# Patient Record
Sex: Male | Born: 1985 | Race: Black or African American | Hispanic: No | Marital: Married | Smoking: Current some day smoker
Health system: Southern US, Community
[De-identification: ages and names within clinical notes are randomized; demographics above are authoritative.]

## PROBLEM LIST (undated history)

## (undated) DIAGNOSIS — T7840XA Allergy, unspecified, initial encounter: Secondary | ICD-10-CM

## (undated) DIAGNOSIS — J45909 Unspecified asthma, uncomplicated: Secondary | ICD-10-CM

## (undated) HISTORY — DX: Allergy, unspecified, initial encounter: T78.40XA

---
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
## 2009-03-05 ENCOUNTER — Emergency Department (HOSPITAL_COMMUNITY): Admission: EM | Admit: 2009-03-05 | Discharge: 2009-03-05 | Payer: Self-pay | Admitting: Emergency Medicine

## 2009-03-08 ENCOUNTER — Emergency Department (HOSPITAL_COMMUNITY): Admission: EM | Admit: 2009-03-08 | Discharge: 2009-03-08 | Payer: Self-pay | Admitting: Emergency Medicine

## 2010-01-27 ENCOUNTER — Emergency Department (HOSPITAL_COMMUNITY): Admission: EM | Admit: 2010-01-27 | Discharge: 2010-01-27 | Payer: Self-pay | Admitting: Emergency Medicine

## 2010-03-01 ENCOUNTER — Emergency Department (HOSPITAL_COMMUNITY): Admission: EM | Admit: 2010-03-01 | Discharge: 2010-03-01 | Payer: Self-pay | Admitting: Emergency Medicine

## 2010-11-07 LAB — WOUND CULTURE

## 2013-01-12 ENCOUNTER — Emergency Department (HOSPITAL_COMMUNITY): Payer: Self-pay

## 2013-01-12 ENCOUNTER — Encounter (HOSPITAL_COMMUNITY): Payer: Self-pay | Admitting: Emergency Medicine

## 2013-01-12 ENCOUNTER — Emergency Department (HOSPITAL_COMMUNITY)
Admission: EM | Admit: 2013-01-12 | Discharge: 2013-01-12 | Disposition: A | Discharge status: Home / Self Care | Payer: Self-pay | Attending: Emergency Medicine | Admitting: Emergency Medicine

## 2013-01-12 DIAGNOSIS — R05 Cough: Secondary | ICD-10-CM | POA: Insufficient documentation

## 2013-01-12 DIAGNOSIS — R059 Cough, unspecified: Secondary | ICD-10-CM | POA: Insufficient documentation

## 2013-01-12 DIAGNOSIS — R6883 Chills (without fever): Secondary | ICD-10-CM | POA: Insufficient documentation

## 2013-01-12 DIAGNOSIS — R0602 Shortness of breath: Secondary | ICD-10-CM | POA: Insufficient documentation

## 2013-01-12 DIAGNOSIS — J45901 Unspecified asthma with (acute) exacerbation: Secondary | ICD-10-CM | POA: Insufficient documentation

## 2013-01-12 DIAGNOSIS — J029 Acute pharyngitis, unspecified: Secondary | ICD-10-CM | POA: Insufficient documentation

## 2013-01-12 HISTORY — DX: Unspecified asthma, uncomplicated: J45.909

## 2013-01-12 MED ORDER — ALBUTEROL SULFATE (5 MG/ML) 0.5% IN NEBU
2.5000 mg | INHALATION_SOLUTION | Freq: Once | RESPIRATORY_TRACT | Status: AC
  Administered 2013-01-12: 2.5 mg via RESPIRATORY_TRACT
  Filled 2013-01-12: qty 0.5

## 2013-01-12 MED ORDER — ALBUTEROL SULFATE HFA 108 (90 BASE) MCG/ACT IN AERS
2.0000 | INHALATION_SPRAY | Freq: Once | RESPIRATORY_TRACT | Status: AC
  Administered 2013-01-12: 2 via RESPIRATORY_TRACT
  Filled 2013-01-12 (×2): qty 6.7

## 2013-01-12 NOTE — ED Provider Notes (Signed)
History    This chart was scribed for non-physician practitioner working with Derwood Kaplan, MD by Leone Payor, ED Scribe. This patient was seen in room TR07C/TR07C and the patient's care was started at 2103.   CSN: [id number]  Arrival date & time 01/12/13  2103   None     Chief Complaint  Patient presents with  . URI      The history is provided by the patient. No language interpreter was used.    HPI Comments: Henry Brady is a 27 y.o. male with a h/o of asthma who presents to the Emergency Department complaining of ongoing, gradual onset, gradually worsening cough productive of green mucous starting 6 days ago. Pt has associated wheezing for which he used his daughter's nebulizer earlier today. States it gave him relief for 10 minutes. Pt also has chills and a sore throat that also started 6 days ago. He has asthma and the coughing is causing him to have trouble breathing. He denies fever.    Past Medical History  Diagnosis Date  . Asthma     History reviewed. No pertinent past surgical history.  History reviewed. No pertinent family history.  History  Substance Use Topics  . Smoking status: Not on file  . Smokeless tobacco: Not on file  . Alcohol Use: Not on file      Review of Systems  Constitutional: Positive for chills. Negative for fever.  HENT: Positive for sore throat.   Respiratory: Positive for cough and wheezing.   All other systems reviewed and are negative.    Allergies  Review of patient's allergies indicates no known allergies.  Home Medications  No current outpatient prescriptions on file.  BP 143/85  Pulse 103  Temp(Src) 98.4 F (36.9 C) (Oral)  Resp 16  SpO2 95%  Physical Exam  Nursing note and vitals reviewed. Constitutional: He is oriented to person, place, and time. He appears well-developed and well-nourished. No distress.  HENT:  Head: Normocephalic and atraumatic.  Mouth/Throat: Oropharynx is clear and moist. No  oropharyngeal exudate.  No sinus tenderness to palpation.   Eyes: EOM are normal.  Neck: Neck supple. No tracheal deviation present.  Cardiovascular: Normal rate, regular rhythm and normal heart sounds.   Pulmonary/Chest: Effort normal. No respiratory distress. He has wheezes.  Wheezes noted throughout bilateral lung fields, mostly on L side.   Musculoskeletal: Normal range of motion.  Neurological: He is alert and oriented to person, place, and time.  Skin: Skin is warm and dry.  Psychiatric: He has a normal mood and affect. His behavior is normal.    ED Course  Procedures (including critical care time)  DIAGNOSTIC STUDIES: Oxygen Saturation is 95% on room air, adequate by my interpretation.    COORDINATION OF CARE: 9:49 PM Discussed treatment plan with pt at bedside and pt agreed to plan.   Labs Reviewed - No data to display Dg Chest 2 View  01/12/2013   *RADIOLOGY REPORT*  Clinical Data: Shortness of breath and cough; history of asthma.  CHEST - 2 VIEW  Comparison: None.  Findings: The lungs are well-aerated and clear.  There is no evidence of focal opacification, pleural effusion or pneumothorax.  The heart is normal in size; the mediastinal contour is within normal limits.  No acute osseous abnormalities are seen.  IMPRESSION: No acute cardiopulmonary process seen.   Original Report Authenticated By: Tonia Ghent, M.D.     1. Asthma exacerbation       MDM  10:03 PM  Chest xray pending. Patient reports relief after nebulizer treatment. Vitals stable and patient afebrile.   10:24 PM Chest xray shows no acute changes. Patient will have albuterol inhaler to go home with. Patient instructed to return with worsening or concerning symptoms.     I personally performed the services described in this documentation, which was scribed in my presence. The recorded information has been reviewed and is accurate.   Emilia Beck, PA-C 01/12/13 2318   Medical screening  examination/treatment/procedure(s) were performed by non-physician practitioner and as supervising physician I was immediately available for consultation/collaboration.   Derwood Kaplan, MD 01/13/13 0028

## 2013-01-12 NOTE — ED Notes (Signed)
Pt states he used his daughters nebulizer tx at home earlier today, and it gave him relief for .

## 2013-01-12 NOTE — ED Notes (Signed)
Pt c/o cough, wheezing,chills and sore throat that started Monday. Pt states he has been coughing up greenish colored mucus.

## 2017-01-29 ENCOUNTER — Emergency Department (HOSPITAL_COMMUNITY)
Admission: EM | Admit: 2017-01-29 | Discharge: 2017-01-30 | Disposition: A | Discharge status: Home / Self Care | Payer: BLUE CROSS/BLUE SHIELD | Attending: Physician Assistant | Admitting: Physician Assistant

## 2017-01-29 ENCOUNTER — Encounter (HOSPITAL_COMMUNITY): Payer: Self-pay | Admitting: Emergency Medicine

## 2017-01-29 DIAGNOSIS — J45909 Unspecified asthma, uncomplicated: Secondary | ICD-10-CM | POA: Insufficient documentation

## 2017-01-29 DIAGNOSIS — K0889 Other specified disorders of teeth and supporting structures: Secondary | ICD-10-CM | POA: Diagnosis not present

## 2017-01-29 DIAGNOSIS — F17200 Nicotine dependence, unspecified, uncomplicated: Secondary | ICD-10-CM | POA: Insufficient documentation

## 2017-01-29 NOTE — ED Triage Notes (Signed)
Pt to ED bilateral upper dental pain x 3 days - states it is recurring, reports abscess on R upper side and possibly the same on L upper side. Pt denies fevers/chills.  +

## 2017-01-30 MED ORDER — LIDOCAINE VISCOUS 2 % MT SOLN: 15.0000 mL | OROMUCOSAL | 2 refills | Status: DC | PRN

## 2017-01-30 MED ORDER — HYDROCODONE-ACETAMINOPHEN 5-325 MG PO TABS: 1.0000 | ORAL_TABLET | Freq: Four times a day (QID) | ORAL | 0 refills | Status: DC | PRN

## 2017-01-30 MED ORDER — PENICILLIN V POTASSIUM 500 MG PO TABS: 500.0000 mg | ORAL_TABLET | Freq: Four times a day (QID) | ORAL | 0 refills | Status: AC

## 2017-01-30 MED ORDER — IBUPROFEN 600 MG PO TABS: 600.0000 mg | ORAL_TABLET | Freq: Four times a day (QID) | ORAL | 0 refills | Status: DC | PRN

## 2017-01-30 NOTE — Discharge Instructions (Signed)
You have been seen today for dental pain. You should follow up with a dentist as soon as possible. This problem will not resolve on its own without the care of a dentist. Use ibuprofen or naproxen for pain. Use the viscous lidocaine for mouth pain. Swish with the lidocaine and spit it out. Do not swallow it.  Antiinflammatory medications: Take 600 mg of ibuprofen every 6 hours or 440 mg (over the counter dose) to 500 mg (prescription dose) of naproxen every 12 hours or for the next 3 days. After this time, these medications may be used as needed for pain. Take these medications with food to avoid upset stomach. Choose only one of these medications, do not take them together.  Please take all of your antibiotics until finished!   You may develop abdominal discomfort or diarrhea from the antibiotic.  You may help offset this with probiotics which you can buy or get in yogurt. Do not eat or take the probiotics until 2 hours after your antibiotic.   [place], [address] [place], [place] [address] [phone]  [place], [place] [address] [phone]  [place] [address] [place], [place] [address] [phone] ext. [place] [address] [place] [address] [place], [place] [address] [phone]  [facility] [address] [place], [place] [address] [phone]  [facility] [employer] [email or web address]  [place] [address] [place], [place] [address] [phone]  Website for free, low-income, or sliding scale dental services in [place]: www.freedental.us  To find a dentist in [place] and surrounding areas: CardCollectible.com.ee  M[facility] [email or web address]  [facility] Medicaid  Dentist [email or web address]

## 2017-01-30 NOTE — ED Provider Notes (Signed)
[facility] DEPT Provider Note   CSN: [id number] Arrival date & time: 01/29/17  2251     History   Chief Complaint Chief Complaint  Patient presents with  . Dental Pain    HPI Henry Brady is a 31 y.o. male.  HPI   Henry Brady is a 31 y.o. male, with a history of Asthma, presenting to the ED with left upper rear most dental pain beginning last week. Pain is moderate to severe, throbbing, radiating towards the left ear. Right upper dental pain beginning 3 days ago. Same description. Has tried Tylenol without relief. Denies fever, nausea/vomiting, difficulty breathing or swallowing, or any other complaints.    Past Medical History:  Diagnosis Date  . Asthma     There are no active problems to display for this patient.   History reviewed. No pertinent surgical history.     Home Medications    Prior to Admission medications   Medication Sig Start Date End Date Taking? Authorizing Provider  HYDROcodone-acetaminophen (NORCO/VICODIN) 5-325 MG tablet Take 1 tablet by mouth every 6 (six) hours as needed. 01/30/17   Raziyah Vanvleck C, PA-C  ibuprofen (ADVIL,MOTRIN) 600 MG tablet Take 1 tablet (600 mg total) by mouth every 6 (six) hours as needed. 01/30/17   Abdoulie Tierce C, PA-C  lidocaine (XYLOCAINE) 2 % solution Use as directed 15 mLs in the mouth or throat as needed for mouth pain. 01/30/17   Jobie Popp C, PA-C  penicillin v potassium (VEETID) 500 MG tablet Take 1 tablet (500 mg total) by mouth 4 (four) times daily. 01/30/17 02/06/17  Lorayne Bender, PA-C    Family History No family history on file.  Social History Social History  Substance Use Topics  . Smoking status: Current Some Day Smoker    Types: Cigars  . Smokeless tobacco: Never Used  . Alcohol use Yes     Comment: occ     Allergies   Patient has no known allergies.   Review of Systems Review of Systems  Constitutional: Negative for chills and fever.  HENT: Positive for dental problem. Negative for  drooling and trouble swallowing.   Gastrointestinal: Negative for nausea and vomiting.     Physical Exam Updated Vital Signs BP 133/86 (BP Location: Left Arm)   Pulse 69   Temp 97.6 F (36.4 C) (Oral)   Resp 18   Ht 5\' 6"  (1.676 m)   Wt 61.2 kg (135 lb)   SpO2 100%   BMI 21.79 kg/m   Physical Exam  Constitutional: He appears well-developed and well-nourished. No distress.  HENT:  Head: Normocephalic and atraumatic.  Significant dental caries to the left upper rear most molar with tenderness to the tooth.  Tenderness and significant dental caries to the right upper first molar. No noted area of swelling or fluctuance. Handles oral secretions without difficulty. No trismus.  Eyes: Conjunctivae are normal.  Neck: Normal range of motion. Neck supple.  Cardiovascular: Normal rate and regular rhythm.   Pulmonary/Chest: Effort normal.  Lymphadenopathy:    He has no cervical adenopathy.  Neurological: He is alert.  Skin: Skin is warm and dry. He is not diaphoretic.  Psychiatric: He has a normal mood and affect. His behavior is normal.  Nursing note and vitals reviewed.    ED Treatments / Results  Labs (all labs ordered are listed, but only abnormal results are displayed) Labs Reviewed - No data to display  EKG  EKG Interpretation None       Radiology No  results found.  Procedures Procedures (including critical care time)  Medications Ordered in ED Medications - No data to display   Initial Impression / Assessment and Plan / ED Course  I have reviewed the triage vital signs and the nursing notes.  Pertinent labs & imaging results that were available during my care of the patient were reviewed by me and considered in my medical decision making (see chart for details).     Patient presents with dental pain. No signs of Ludwig's angioedema or sepsis. Patient was offered a dental block with explanation, but declined. Dentist follow-up. Resources given. The  patient was given instructions for home care as well as return precautions. Patient voices understanding of these instructions, accepts the plan, and is comfortable with discharge.   Final Clinical Impressions(s) / ED Diagnoses   Final diagnoses:  Pain, dental    New Prescriptions Discharge Medication List as of 01/30/2017  1:21 AM    START taking these medications   Details  HYDROcodone-acetaminophen (NORCO/VICODIN) 5-325 MG tablet Take 1 tablet by mouth every 6 (six) hours as needed., Starting Mon 01/30/2017, Print    ibuprofen (ADVIL,MOTRIN) 600 MG tablet Take 1 tablet (600 mg total) by mouth every 6 (six) hours as needed., Starting Mon 01/30/2017, Print    lidocaine (XYLOCAINE) 2 % solution Use as directed 15 mLs in the mouth or throat as needed for mouth pain., Starting Mon 01/30/2017, Print    penicillin v potassium (VEETID) 500 MG tablet Take 1 tablet (500 mg total) by mouth 4 (four) times daily., Starting Mon 01/30/2017, Until Mon 02/06/2017, Print         Justin Meisenheimer, [place] C, PA-C 01/31/17 9604    Macarthur Critchley, MD 02/02/17 0740

## 2017-11-15 ENCOUNTER — Emergency Department (HOSPITAL_COMMUNITY): Payer: BLUE CROSS/BLUE SHIELD

## 2017-11-15 ENCOUNTER — Encounter (HOSPITAL_COMMUNITY): Payer: Self-pay | Admitting: Emergency Medicine

## 2017-11-15 DIAGNOSIS — R0602 Shortness of breath: Secondary | ICD-10-CM | POA: Diagnosis not present

## 2017-11-15 DIAGNOSIS — Z5321 Procedure and treatment not carried out due to patient leaving prior to being seen by health care provider: Secondary | ICD-10-CM | POA: Insufficient documentation

## 2017-11-15 NOTE — ED Triage Notes (Signed)
Reports URI symptoms that started last night along with SOB.  Hx of asthma.  Taking ibuprofen and mucinex.

## 2017-11-16 ENCOUNTER — Encounter (HOSPITAL_COMMUNITY): Payer: Self-pay | Admitting: Emergency Medicine

## 2017-11-16 ENCOUNTER — Ambulatory Visit (HOSPITAL_COMMUNITY): Payer: BLUE CROSS/BLUE SHIELD

## 2017-11-16 ENCOUNTER — Emergency Department (HOSPITAL_COMMUNITY)
Admission: EM | Admit: 2017-11-16 | Discharge: 2017-11-16 | Disposition: A | Discharge status: Home / Self Care | Payer: BLUE CROSS/BLUE SHIELD | Attending: Emergency Medicine | Admitting: Emergency Medicine

## 2017-11-16 ENCOUNTER — Ambulatory Visit (HOSPITAL_COMMUNITY)
Admission: EM | Admit: 2017-11-16 | Discharge: 2017-11-16 | Disposition: A | Discharge status: Home / Self Care | Payer: BLUE CROSS/BLUE SHIELD | Source: Home / Self Care

## 2017-11-16 ENCOUNTER — Other Ambulatory Visit (HOSPITAL_COMMUNITY): Payer: Self-pay | Admitting: Radiology

## 2017-11-16 DIAGNOSIS — R69 Illness, unspecified: Secondary | ICD-10-CM | POA: Diagnosis not present

## 2017-11-16 DIAGNOSIS — J111 Influenza due to unidentified influenza virus with other respiratory manifestations: Secondary | ICD-10-CM

## 2017-11-16 MED ORDER — ACETAMINOPHEN 325 MG PO TABS
650.0000 mg | ORAL_TABLET | Freq: Once | ORAL | Status: AC
  Administered 2017-11-16: 650 mg via ORAL

## 2017-11-16 MED ORDER — OSELTAMIVIR PHOSPHATE 75 MG PO CAPS: 75.0000 mg | ORAL_CAPSULE | Freq: Two times a day (BID) | ORAL | 0 refills | Status: AC

## 2017-11-16 MED ORDER — ACETAMINOPHEN 325 MG PO TABS
ORAL_TABLET | ORAL | Status: AC
  Filled 2017-11-16: qty 2

## 2017-11-16 NOTE — ED Provider Notes (Signed)
[facility]    CSN: [id number] Arrival date & time: 11/16/17  1814     History   Chief Complaint Chief Complaint  Patient presents with  . Fever    HPI Henry Brady is a 32 y.o. male.   Henry Brady presents with complaints of chills, body aches, productive cough, mild nausea. Symptoms started two days ago. Has been taking ibuprofen, mucinex which have minimally helped with symptoms. Using inhaler which helps. Denies ear pain but states had it yesterday, decreased appetite, sore throat has improved as well. Did not get a flu vaccine this season. Drinking fluids and urinating.    ROS per HPI.      Past Medical History:  Diagnosis Date  . Asthma     There are no active problems to display for this patient.   History reviewed. No pertinent surgical history.     Home Medications    Prior to Admission medications   Medication Sig Start Date End Date Taking? Authorizing Provider  HYDROcodone-acetaminophen (NORCO/VICODIN) 5-325 MG tablet Take 1 tablet by mouth every 6 (six) hours as needed. 01/30/17   Joy, Shawn C, PA-C  ibuprofen (ADVIL,MOTRIN) 600 MG tablet Take 1 tablet (600 mg total) by mouth every 6 (six) hours as needed. 01/30/17   Joy, Shawn C, PA-C  lidocaine (XYLOCAINE) 2 % solution Use as directed 15 mLs in the mouth or throat as needed for mouth pain. 01/30/17   Joy, Shawn C, PA-C  oseltamivir (TAMIFLU) 75 MG capsule Take 1 capsule (75 mg total) by mouth every 12 (twelve) hours for 5 days. 11/16/17 11/21/17  Zigmund Gottron, NP    Family History No family history on file.  Social History Social History   Tobacco Use  . Smoking status: Current Some Day Smoker    Types: Cigars  . Smokeless tobacco: Never Used  Substance Use Topics  . Alcohol use: Yes    Comment: occ  . Drug use: No     Allergies   Patient has no known allergies.   Review of Systems Review of Systems   Physical Exam Triage Vital Signs ED Triage Vitals [11/16/17 1840]    Enc Vitals Group     BP 116/63     Pulse Rate (!) 106     Resp 18     Temp (!) 101.7 F (38.7 C)     Temp Source Oral     SpO2 100 %     Weight      Height      Head Circumference      Peak Flow      Pain Score      Pain Loc      Pain Edu?      Excl. in [place]?    No data found.  Updated Vital Signs BP 116/63   Pulse (!) 106   Temp (!) 101.7 F (38.7 C) (Oral)   Resp 18   SpO2 100%   Visual Acuity Right Eye Distance:   Left Eye Distance:   Bilateral Distance:    Right Eye Near:   Left Eye Near:    Bilateral Near:     Physical Exam  Constitutional: He is oriented to person, place, and time. He appears well-developed and well-nourished.  HENT:  Head: Normocephalic and atraumatic.  Right Ear: Tympanic membrane, external ear and ear canal normal.  Left Ear: Tympanic membrane, external ear and ear canal normal.  Nose: Rhinorrhea present. Right sinus exhibits no maxillary sinus tenderness and  no frontal sinus tenderness. Left sinus exhibits no maxillary sinus tenderness and no frontal sinus tenderness.  Mouth/Throat: Uvula is midline, oropharynx is clear and moist and mucous membranes are normal.  Eyes: Pupils are equal, round, and reactive to light. Conjunctivae are normal.  Neck: Normal range of motion.  Cardiovascular: Regular rhythm. Tachycardia present.  Pulmonary/Chest: Effort normal and breath sounds normal.  Lymphadenopathy:    He has no cervical adenopathy.  Neurological: He is alert and oriented to person, place, and time.  Skin: Skin is warm and dry.  Vitals reviewed.    UC Treatments / Results  Labs (all labs ordered are listed, but only abnormal results are displayed) Labs Reviewed - No data to display  EKG None Radiology Dg Chest 2 View  Result Date: 11/15/2017 CLINICAL DATA:  Shortness of breath EXAM: CHEST - 2 VIEW COMPARISON:  01/12/2013 FINDINGS: The heart size and mediastinal contours are within normal limits. Both lungs are clear. The  visualized skeletal structures are unremarkable. IMPRESSION: No active cardiopulmonary disease. Electronically Signed   By: Donavan Foil M.D.   On: 11/15/2017 23:54    Procedures Procedures (including critical care time)  Medications Ordered in UC Medications  acetaminophen (TYLENOL) tablet 650 mg (650 mg Oral Given 11/16/17 1846)     Initial Impression / Assessment and Plan / UC Course  I have reviewed the triage vital signs and the nursing notes.  Pertinent labs & imaging results that were available during my care of the patient were reviewed by me and considered in my medical decision making (see chart for details).     Chest xray yesterday negative, declined additional today. Without significant acute findings on physical exam. Febrile and tachycardic on exam. Tylenol provided. Appears consistent with influenza, tamiflu provided. Continue with supportive cares. Return precautions provided. Patient verbalized understanding and agreeable to plan.    Final Clinical Impressions(s) / UC Diagnoses   Final diagnoses:  Influenza-like illness    ED Discharge Orders        Ordered    oseltamivir (TAMIFLU) 75 MG capsule  Every 12 hours     11/16/17 1954       Controlled Substance Prescriptions [place] Controlled Substance Registry consulted? Not Applicable   Zigmund Gottron, NP 11/16/17 2009

## 2017-11-16 NOTE — Discharge Instructions (Signed)
Push fluids to ensure adequate hydration and keep secretions thin.  Tylenol and/or ibuprofen as needed for pain or fevers.

## 2017-11-16 NOTE — ED Triage Notes (Signed)
Pt c/o chills, body aches, pt hx of asthma which has irritated his chest, pt has fever, last had ibuprofen this morning.

## 2017-11-16 NOTE — ED Notes (Signed)
Called for Pt to RM. Pt did not answer

## 2017-11-16 NOTE — ED Provider Notes (Signed)
Staff reports patient has not answered in the waiting room.  Left without being seen.   Charlesetta Shanks, MD 11/16/17 [phone]

## 2017-11-16 NOTE — ED Notes (Signed)
Updated on wait for treatment room.  Pt wanting to leave.  Encouraged to stay to be seen.

## 2017-12-18 NOTE — Progress Notes (Signed)
Subjective:    Patient ID: Henry Brady, male    DOB: 03-02-1986, 32 y.o.   MRN: [id number]  HPI:  Henry Brady is here to establish as a new pt.   He is a pleasant 32 year old male.  PMH: Asthma, initially dx'd at age 46 or 18. He estimates that he has asthma sx's 2 days/ week and 1-2 nights/month, mild persistent asthma He has been acutely ill the last three weeks with productive cough (thick/green mucus), wheezing, and chest tightness. He has been needing to use Albuterol several times/daily over the last two weeks He reports smoking cigars, 2-3/day. He denies reports only social ETOH use He is adopted, so he is unaware of any family medical hx His wife is at [facility] during [place]  Patient Care Team    Relationship Specialty Notifications Start End  Henry Brady, Berna Spare, NP PCP - General Family Medicine  11/10/17     There are no active problems to display for this patient.    Past Medical History:  Diagnosis Date  . Asthma      History reviewed. No pertinent surgical history.   Family History  Adopted: Yes     Social History   Substance and Sexual Activity  Drug Use No     Social History   Substance and Sexual Activity  Alcohol Use Yes   Comment: occ     Social History   Tobacco Use  Smoking Status Current Some Day Smoker  . Years: 2.00  . Types: Cigars  Smokeless Tobacco Never Used  Tobacco Comment   smokes 2 cigars per day     Outpatient Encounter Medications as of 12/19/2017  Medication Sig  . albuterol (PROVENTIL HFA;VENTOLIN HFA) 108 (90 Base) MCG/ACT inhaler Inhale 2 puffs into the lungs every 6 (six) hours as needed for wheezing or shortness of breath.  Marland Kitchen ibuprofen (ADVIL,MOTRIN) 600 MG tablet Take 1 tablet (600 mg total) by mouth every 6 (six) hours as needed.  Marland Kitchen azithromycin (ZITHROMAX) 250 MG tablet 2 tabs days one. 1 tab days two-five  . fluticasone (FLONASE) 50 MCG/ACT nasal spray Place 2 sprays into both nostrils daily.  . montelukast (SINGULAIR)  10 MG tablet Take 1 tablet (10 mg total) by mouth at bedtime.  . nicotine (NICODERM CQ - DOSED IN MG/24 HOURS) 14 mg/24hr patch Place 1 patch (14 mg total) onto the skin daily.  . [DISCONTINUED] HYDROcodone-acetaminophen (NORCO/VICODIN) 5-325 MG tablet Take 1 tablet by mouth every 6 (six) hours as needed.  . [DISCONTINUED] lidocaine (XYLOCAINE) 2 % solution Use as directed 15 mLs in the mouth or throat as needed for mouth pain.   No facility-administered encounter medications on file as of 12/19/2017.     Allergies: Patient has no known allergies.  Body mass index is 20.78 kg/m.  Blood pressure 115/73, pulse 81, height 5' 8.75" (1.746 m), weight 139 lb 11.2 oz (63.4 kg), SpO2 100 %.      Review of Systems  Constitutional: Positive for fatigue. Negative for activity change, appetite change, chills, diaphoresis, fever and unexpected weight change.  HENT: Positive for congestion, postnasal drip, rhinorrhea and sinus pressure. Negative for voice change.   Eyes: Negative for visual disturbance.  Respiratory: Positive for cough, chest tightness, shortness of breath and wheezing. Negative for stridor.   Cardiovascular: Negative for chest pain, palpitations and leg swelling.  Gastrointestinal: Negative for abdominal distention, abdominal pain, blood in stool, constipation, diarrhea, nausea and vomiting.  Endocrine: Negative for cold intolerance, heat intolerance,  polydipsia, polyphagia and polyuria.  Genitourinary: Negative for difficulty urinating and flank pain.  Skin: Negative for color change, pallor, rash and wound.  Neurological: Negative for dizziness and headaches.  Hematological: Does not bruise/bleed easily.  Psychiatric/Behavioral: Positive for sleep disturbance. Negative for dysphoric mood, hallucinations, self-injury and suicidal ideas. The patient is not nervous/anxious and is not hyperactive.        Objective:   Physical Exam  Constitutional: He appears well-developed and  well-nourished. No distress.  HENT:  Head: Normocephalic and atraumatic.  Right Ear: External ear normal. Tympanic membrane is bulging. Tympanic membrane is not erythematous. No decreased hearing is noted.  Left Ear: External ear normal. Tympanic membrane is bulging. Tympanic membrane is not erythematous. No decreased hearing is noted.  Nose: Mucosal edema and rhinorrhea present. Right sinus exhibits no maxillary sinus tenderness and no frontal sinus tenderness. Left sinus exhibits no maxillary sinus tenderness and no frontal sinus tenderness.  Mouth/Throat: Uvula is midline. Posterior oropharyngeal edema and posterior oropharyngeal erythema present. No oropharyngeal exudate or tonsillar abscesses.  Eyes: Pupils are equal, round, and reactive to light. EOM are normal.  Neck: Normal range of motion. Neck supple.  Cardiovascular: Normal rate, regular rhythm, normal heart sounds and intact distal pulses.  No murmur heard. Pulmonary/Chest: Effort normal. No stridor. No respiratory distress. He has wheezes. He has no rales. He exhibits no tenderness.  Lymphadenopathy:    He has no cervical adenopathy.  Skin: Skin is warm and dry. No rash noted. He is not diaphoretic. No erythema. No pallor.  Psychiatric: He has a normal mood and affect. His behavior is normal. Judgment and thought content normal.  Nursing note and vitals reviewed.     Assessment & Plan:   1. Need for Tdap vaccination   2. Healthcare maintenance   3. Mild persistent asthma with acute exacerbation   4. Acute bronchitis, unspecified organism     Asthma Initially dx'd at age [age] Sx's are consistent with mild persistent asthma Continue albuterol as needed Started on montelukast 10mg  QD Wear mask when going to be exposed to known allergens   Acute bronchitis Please take Azithromycin and Singular as directed. Please use albuterol and flonase as needed. Recommend wearing a mask when you will be exposed to known  allergens.   Healthcare maintenance Please take Azithromycin and Singular as directed. Please use albuterol and flonase as needed. Increase water intake and follow a Mediterranean diet. Recommend wearing a mask when you will be exposed to known allergens. Recommend scheduling fasting lab appt in a few months, then a complete physical the next week.    FOLLOW-UP:  Return in about 3 months (around 03/20/2018) for Fasting Labs, CPE.

## 2017-12-19 ENCOUNTER — Encounter: Payer: Self-pay | Admitting: Adult Health

## 2017-12-19 ENCOUNTER — Ambulatory Visit (INDEPENDENT_AMBULATORY_CARE_PROVIDER_SITE_OTHER): Payer: BLUE CROSS/BLUE SHIELD | Admitting: Adult Health

## 2017-12-19 VITALS — BP 115/73 | HR 81 | Ht 68.75 in | Wt 139.7 lb

## 2017-12-19 DIAGNOSIS — J209 Acute bronchitis, unspecified: Secondary | ICD-10-CM | POA: Diagnosis not present

## 2017-12-19 DIAGNOSIS — Z23 Encounter for immunization: Secondary | ICD-10-CM

## 2017-12-19 DIAGNOSIS — J4531 Mild persistent asthma with (acute) exacerbation: Secondary | ICD-10-CM | POA: Diagnosis not present

## 2017-12-19 DIAGNOSIS — J45909 Unspecified asthma, uncomplicated: Secondary | ICD-10-CM | POA: Insufficient documentation

## 2017-12-19 DIAGNOSIS — Z0000 Encounter for general adult medical examination without abnormal findings: Secondary | ICD-10-CM

## 2017-12-19 MED ORDER — AZITHROMYCIN 250 MG PO TABS: ORAL_TABLET | ORAL | 0 refills | Status: DC

## 2017-12-19 MED ORDER — NICOTINE 14 MG/24HR TD PT24: 14.0000 mg | MEDICATED_PATCH | Freq: Every day | TRANSDERMAL | 0 refills | Status: DC

## 2017-12-19 MED ORDER — FLUTICASONE PROPIONATE 50 MCG/ACT NA SUSP: 2.0000 | Freq: Every day | NASAL | 6 refills | Status: DC

## 2017-12-19 MED ORDER — MONTELUKAST SODIUM 10 MG PO TABS: 10.0000 mg | ORAL_TABLET | Freq: Every day | ORAL | 2 refills | Status: DC

## 2017-12-19 NOTE — Assessment & Plan Note (Signed)
Please take Azithromycin and Singular as directed. Please use albuterol and flonase as needed. Increase water intake and follow a Mediterranean diet. Recommend wearing a mask when you will be exposed to known allergens. Recommend scheduling fasting lab appt in a few months, then a complete physical the next week.

## 2017-12-19 NOTE — Assessment & Plan Note (Signed)
Please take Azithromycin and Singular as directed. Please use albuterol and flonase as needed. Recommend wearing a mask when you will be exposed to known allergens.

## 2017-12-19 NOTE — Patient Instructions (Addendum)
Mediterranean Diet A Mediterranean diet refers to food and lifestyle choices that are based on the traditions of countries located on the [place]. This way of eating has been shown to help prevent certain conditions and improve outcomes for people who have chronic diseases, like kidney disease and heart disease. What are tips for following this plan? Lifestyle  Cook and eat meals together with your family, when possible.  Drink enough fluid to keep your urine clear or pale yellow.  Be physically active every day. This includes: ? Aerobic exercise like running or swimming. ? Leisure activities like gardening, walking, or housework.  Get 7-8 hours of sleep each night.  If recommended by your health care provider, drink red wine in moderation. This means 1 glass a day for nonpregnant women and 2 glasses a day for men. A glass of wine equals 5 oz (150 mL). Reading food labels  Check the serving size of packaged foods. For foods such as rice and pasta, the serving size refers to the amount of cooked product, not dry.  Check the total fat in packaged foods. Avoid foods that have saturated fat or trans fats.  Check the ingredients list for added sugars, such as corn syrup. Shopping  At the grocery store, buy most of your food from the areas near the walls of the store. This includes: ? Fresh fruits and vegetables (produce). ? Grains, beans, nuts, and seeds. Some of these may be available in unpackaged forms or large amounts (in bulk). ? Fresh seafood. ? Poultry and eggs. ? Low-fat dairy products.  Buy whole ingredients instead of prepackaged foods.  Buy fresh fruits and vegetables in-season from local farmers markets.  Buy frozen fruits and vegetables in resealable bags.  If you do not have access to quality fresh seafood, buy precooked frozen shrimp or canned fish, such as tuna, salmon, or sardines.  Buy small amounts of raw or cooked vegetables, salads, or olives from the  deli or salad bar at your store.  Stock your pantry so you always have certain foods on hand, such as olive oil, canned tuna, canned tomatoes, rice, pasta, and beans. Cooking  Cook foods with extra-virgin olive oil instead of using butter or other vegetable oils.  Have meat as a side dish, and have vegetables or grains as your main dish. This means having meat in small portions or adding small amounts of meat to foods like pasta or stew.  Use beans or vegetables instead of meat in common dishes like chili or lasagna.  Experiment with different cooking methods. Try roasting or broiling vegetables instead of steaming or sauteing them.  Add frozen vegetables to soups, stews, pasta, or rice.  Add nuts or seeds for added healthy fat at each meal. You can add these to yogurt, salads, or vegetable dishes.  Marinate fish or vegetables using olive oil, lemon juice, garlic, and fresh herbs. Meal planning  Plan to eat 1 vegetarian meal one day each week. Try to work up to 2 vegetarian meals, if possible.  Eat seafood 2 or more times a week.  Have healthy snacks readily available, such as: ? Vegetable sticks with hummus. ? Greek yogurt. ? Fruit and nut trail mix.  Eat balanced meals throughout the week. This includes: ? Fruit: 2-3 servings a day ? Vegetables: 4-5 servings a day ? Low-fat dairy: 2 servings a day ? Fish, poultry, or lean meat: 1 serving a day ? Beans and legumes: 2 or more servings a week ? Nuts   and seeds: 1-2 servings a day ? Whole grains: 6-8 servings a day ? Extra-virgin olive oil: 3-4 servings a day  Limit red meat and sweets to only a few servings a month What are my food choices?  Mediterranean diet ? Recommended ? Grains: Whole-grain pasta. Brown rice. Bulgar wheat. Polenta. Couscous. Whole-wheat bread. [place]. ? Vegetables: Artichokes. Beets. Broccoli. Cabbage. Carrots. Eggplant. Green beans. Chard. Kale. Spinach. Onions. Leeks. Peas. Squash.  Tomatoes. Peppers. Radishes. ? Fruits: Apples. Apricots. Avocado. Berries. Bananas. Cherries. Dates. Figs. Grapes. Lemons. Melon. Oranges. Peaches. Plums. Pomegranate. ? Meats and other protein foods: Beans. Almonds. Sunflower seeds. Pine nuts. Peanuts. [place]. Salmon. Scallops. Shrimp. [place]. Tilapia. Clams. Oysters. Eggs. ? Dairy: Low-fat milk. Cheese. Greek yogurt. ? Beverages: Water. Red wine. Herbal tea. ? Fats and oils: Extra virgin olive oil. Avocado oil. Grape seed oil. ? Sweets and desserts: [place] yogurt with honey. Baked apples. Poached pears. Trail mix. ? Seasoning and other foods: Basil. Cilantro. Coriander. Cumin. Mint. Parsley. Sage. Rosemary. Tarragon. Garlic. Oregano. Thyme. Pepper. Balsalmic vinegar. Tahini. Hummus. Tomato sauce. Olives. Mushrooms. ? Limit these ? Grains: Prepackaged pasta or rice dishes. Prepackaged cereal with added sugar. ? Vegetables: Deep fried potatoes (french fries). ? Fruits: Fruit canned in syrup. ? Meats and other protein foods: Beef. Pork. Lamb. Poultry with skin. Hot dogs. [place] Salines. ? Dairy: Ice cream. Sour cream. Whole milk. ? Beverages: Juice. Sugar-sweetened soft drinks. Beer. Liquor and spirits. ? Fats and oils: Butter. Canola oil. Vegetable oil. Beef fat (tallow). Lard. ? Sweets and desserts: Cookies. Cakes. Pies. Candy. ? Seasoning and other foods: Mayonnaise. Premade sauces and marinades. ? The items listed may not be a complete list. Talk with your dietitian about what dietary choices are right for you. Summary  The Mediterranean diet includes both food and lifestyle choices.  Eat a variety of fresh fruits and vegetables, beans, nuts, seeds, and whole grains.  Limit the amount of red meat and sweets that you eat.  Talk with your health care provider about whether it is safe for you to drink red wine in moderation. This means 1 glass a day for nonpregnant women and 2 glasses a day for men. A glass of wine equals 5 oz (150 mL). This information  is not intended to replace advice given to you by your health care provider. Make sure you discuss any questions you have with your health care provider. Document Released: 03/31/2016 Document Revised: 05/03/2016 Document Reviewed: 03/31/2016 Elsevier Interactive Patient Education  2018 [employer].   Asthma, Adult Asthma is a recurring condition in which the airways tighten and narrow. Asthma can make it difficult to breathe. It can cause coughing, wheezing, and shortness of breath. Asthma episodes, also called asthma attacks, range from minor to life-threatening. Asthma cannot be cured, but medicines and lifestyle changes can help control it. What are the causes? Asthma is believed to be caused by inherited (genetic) and environmental factors, but its exact cause is unknown. Asthma may be triggered by allergens, lung infections, or irritants in the air. Asthma triggers are different for each person. Common triggers include:  Animal dander.  Dust mites.  Cockroaches.  Pollen from trees or grass.  Mold.  Smoke.  Air pollutants such as dust, household cleaners, hair sprays, aerosol sprays, paint fumes, strong chemicals, or strong odors.  Cold air, weather changes, and winds (which increase molds and pollens in the air).  Strong emotional expressions such as crying or laughing hard.  Stress.  Certain medicines (such as aspirin)  or types of drugs (such as beta-blockers).  Sulfites in foods and drinks. Foods and drinks that may contain sulfites include dried fruit, potato chips, and sparkling grape juice.  Infections or inflammatory conditions such as the flu, a cold, or an inflammation of the nasal membranes (rhinitis).  Gastroesophageal reflux disease (GERD).  Exercise or strenuous activity.  What are the signs or symptoms? Symptoms may occur immediately after asthma is triggered or many hours later. Symptoms include:  Wheezing.  Excessive nighttime or early morning  coughing.  Frequent or severe coughing with a common cold.  Chest tightness.  Shortness of breath.  How is this diagnosed? The diagnosis of asthma is made by a review of your medical history and a physical exam. Tests may also be performed. These may include:  Lung function studies. These tests show how much air you breathe in and out.  Allergy tests.  Imaging tests such as X-rays.  How is this treated? Asthma cannot be cured, but it can usually be controlled. Treatment involves identifying and avoiding your asthma triggers. It also involves medicines. There are 2 classes of medicine used for asthma treatment:  Controller medicines. These prevent asthma symptoms from occurring. They are usually taken every day.  Reliever or rescue medicines. These quickly relieve asthma symptoms. They are used as needed and provide short-term relief.  Your health care provider will help you create an asthma action plan. An asthma action plan is a written plan for managing and treating your asthma attacks. It includes a list of your asthma triggers and how they may be avoided. It also includes information on when medicines should be taken and when their dosage should be changed. An action plan may also involve the use of a device called a peak flow meter. A peak flow meter measures how well the lungs are working. It helps you monitor your condition. Follow these instructions at home:  Take medicines only as directed by your health care provider. Speak with your health care provider if you have questions about how or when to take the medicines.  Use a peak flow meter as directed by your health care provider. Record and keep track of readings.  Understand and use the action plan to help minimize or stop an asthma attack without needing to seek medical care.  Control your home environment in the following ways to help prevent asthma attacks: ? Do not smoke. Avoid being exposed to secondhand  smoke. ? Change your heating and air conditioning filter regularly. ? Limit your use of fireplaces and wood stoves. ? Get rid of pests (such as roaches and mice) and their droppings. ? Throw away plants if you see mold on them. ? Clean your floors and dust regularly. Use unscented cleaning products. ? Try to have someone else vacuum for you regularly. Stay out of rooms while they are being vacuumed and for a short while afterward. If you vacuum, use a dust mask from a hardware store, a double-layered or microfilter vacuum cleaner bag, or a vacuum cleaner with a HEPA filter. ? Replace carpet with wood, tile, or vinyl flooring. Carpet can trap dander and dust. ? Use allergy-proof pillows, mattress covers, and box spring covers. ? Wash bed sheets and blankets every week in hot water and dry them in a dryer. ? Use blankets that are made of polyester or cotton. ? Clean bathrooms and kitchens with bleach. If possible, have someone repaint the walls in these rooms with mold-resistant paint. Keep out of the  rooms that are being cleaned and painted. ? Wash hands frequently. Contact a health care provider if:  You have wheezing, shortness of breath, or a cough even if taking medicine to prevent attacks.  The colored mucus you cough up (sputum) is thicker than usual.  Your sputum changes from clear or white to yellow, green, gray, or bloody.  You have any problems that may be related to the medicines you are taking (such as a rash, itching, swelling, or trouble breathing).  You are using a reliever medicine more than 2-3 times per week.  Your peak flow is still at 50-79% of your personal best after following your action plan for 1 hour.  You have a fever. Get help right away if:  You seem to be getting worse and are unresponsive to treatment during an asthma attack.  You are short of breath even at rest.  You get short of breath when doing very little physical activity.  You have difficulty  eating, drinking, or talking due to asthma symptoms.  You develop chest pain.  You develop a fast heartbeat.  You have a bluish color to your lips or fingernails.  You are light-headed, dizzy, or faint.  Your peak flow is less than 50% of your personal best. This information is not intended to replace advice given to you by your health care provider. Make sure you discuss any questions you have with your health care provider. Document Released: 08/08/2005 Document Revised: 01/20/2016 Document Reviewed: 03/07/2013 Elsevier Interactive Patient Education  2017 [employer].  Please take Azithromycin and Singular as directed. Please use albuterol and flonase as needed. Increase water intake and follow a Mediterranean diet. Recommend wearing a mask when you will be exposed to known allergens. Recommend scheduling fasting lab appt in a few months, then a complete physical the next week. WELCOME TO THE PRACTICE!

## 2017-12-19 NOTE — Assessment & Plan Note (Signed)
Initially dx'd at [age] Sx's are consistent with mild persistent asthma Continue albuterol as needed Started on montelukast 10mg  QD Wear mask when going to be exposed to known allergens

## 2018-03-12 ENCOUNTER — Other Ambulatory Visit: Payer: BLUE CROSS/BLUE SHIELD

## 2018-03-12 DIAGNOSIS — Z0000 Encounter for general adult medical examination without abnormal findings: Secondary | ICD-10-CM

## 2018-03-12 DIAGNOSIS — Z0282 Encounter for adoption services: Secondary | ICD-10-CM | POA: Diagnosis not present

## 2018-03-12 DIAGNOSIS — Z789 Other specified health status: Secondary | ICD-10-CM | POA: Diagnosis not present

## 2018-03-13 LAB — COMPREHENSIVE METABOLIC PANEL
ALBUMIN: 4.6 g/dL (ref 3.5–5.5)
ALT: 10 IU/L (ref 0–44)
AST: 32 IU/L (ref 0–40)
Albumin/Globulin Ratio: 1.5 (ref 1.2–2.2)
Alkaline Phosphatase: 76 IU/L (ref 39–117)
BUN / CREAT RATIO: 8 — AB (ref 9–20)
BUN: 10 mg/dL (ref 6–20)
Bilirubin Total: 0.3 mg/dL (ref 0.0–1.2)
CALCIUM: 9.3 mg/dL (ref 8.7–10.2)
CO2: 21 mmol/L (ref 20–29)
CREATININE: 1.22 mg/dL (ref 0.76–1.27)
Chloride: 104 mmol/L (ref 96–106)
GFR calc Af Amer: 90 mL/min/{1.73_m2} (ref >59)
GFR, EST NON AFRICAN AMERICAN: 78 mL/min/{1.73_m2} (ref >59)
GLOBULIN, TOTAL: 3.1 g/dL (ref 1.5–4.5)
GLUCOSE: 86 mg/dL (ref 65–99)
Potassium: 4.9 mmol/L (ref 3.5–5.2)
SODIUM: 142 mmol/L (ref 134–144)
Total Protein: 7.7 g/dL (ref 6.0–8.5)

## 2018-03-13 LAB — LIPID PANEL
CHOLESTEROL TOTAL: 159 mg/dL (ref 100–199)
Chol/HDL Ratio: 2.4 ratio (ref 0.0–5.0)
HDL: 65 mg/dL (ref >39)
LDL Calculated: 81 mg/dL (ref 0–99)
Triglycerides: 63 mg/dL (ref 0–149)
VLDL Cholesterol Cal: 13 mg/dL (ref 5–40)

## 2018-03-13 LAB — CBC WITH DIFFERENTIAL/PLATELET
BASOS: 1 %
Basophils Absolute: 0.1 10*3/uL (ref 0.0–0.2)
EOS (ABSOLUTE): 0.1 10*3/uL (ref 0.0–0.4)
EOS: 2 %
HEMATOCRIT: 48.2 % (ref 37.5–51.0)
HEMOGLOBIN: 16.2 g/dL (ref 13.0–17.7)
IMMATURE GRANULOCYTES: 0 %
Immature Grans (Abs): 0 10*3/uL (ref 0.0–0.1)
LYMPHS ABS: 1.7 10*3/uL (ref 0.7–3.1)
Lymphs: 27 %
MCH: 29.3 pg (ref 26.6–33.0)
MCHC: 33.6 g/dL (ref 31.5–35.7)
MCV: 87 fL (ref 79–97)
MONOCYTES: 6 %
MONOS ABS: 0.4 10*3/uL (ref 0.1–0.9)
NEUTROS PCT: 64 %
Neutrophils Absolute: 3.8 10*3/uL (ref 1.4–7.0)
Platelets: 281 10*3/uL (ref 150–450)
RBC: 5.53 x10E6/uL (ref 4.14–5.80)
RDW: 13.2 % (ref 12.3–15.4)
WBC: 6 10*3/uL (ref 3.4–10.8)

## 2018-03-13 LAB — HEMOGLOBIN A1C
Est. average glucose Bld gHb Est-mCnc: 100 mg/dL
Hgb A1c MFr Bld: 5.1 % (ref 4.8–5.6)

## 2018-03-13 LAB — TSH: TSH: 1.22 u[IU]/mL (ref 0.450–4.500)

## 2018-03-20 ENCOUNTER — Ambulatory Visit (INDEPENDENT_AMBULATORY_CARE_PROVIDER_SITE_OTHER): Payer: BLUE CROSS/BLUE SHIELD | Admitting: Adult Health

## 2018-03-20 ENCOUNTER — Encounter: Payer: Self-pay | Admitting: Adult Health

## 2018-03-20 VITALS — BP 112/65 | HR 80 | Ht 68.75 in | Wt 138.8 lb

## 2018-03-20 DIAGNOSIS — Z0000 Encounter for general adult medical examination without abnormal findings: Secondary | ICD-10-CM

## 2018-03-20 DIAGNOSIS — J4531 Mild persistent asthma with (acute) exacerbation: Secondary | ICD-10-CM | POA: Diagnosis not present

## 2018-03-20 MED ORDER — ALBUTEROL SULFATE HFA 108 (90 BASE) MCG/ACT IN AERS: 2.0000 | INHALATION_SPRAY | Freq: Four times a day (QID) | RESPIRATORY_TRACT | 3 refills | Status: DC | PRN

## 2018-03-20 NOTE — Patient Instructions (Addendum)
Preventive Care for Adults, Male A healthy lifestyle and preventive care can promote health and wellness. Preventive health guidelines for men include the following key practices:  A routine yearly physical is a good way to check with your health care provider about your health and preventative screening. It is a chance to share any concerns and updates on your health and to receive a thorough exam.  Visit your dentist for a routine exam and preventative care every 6 months. Brush your teeth twice a day and floss once a day. Good oral hygiene prevents tooth decay and gum disease.  The frequency of eye exams is based on your age, health, family medical history, use of contact lenses, and other factors. Follow your health care provider's recommendations for frequency of eye exams.  Eat a healthy diet. Foods such as vegetables, fruits, whole grains, low-fat dairy products, and lean protein foods contain the nutrients you need without too many calories. Decrease your intake of foods high in solid fats, added sugars, and salt. Eat the right amount of calories for you.Get information about a proper diet from your health care provider, if necessary.  Regular physical exercise is one of the most important things you can do for your health. Most adults should get at least 150 minutes of moderate-intensity exercise (any activity that increases your heart rate and causes you to sweat) each week. In addition, most adults need muscle-strengthening exercises on 2 or more days a week.  Maintain a healthy weight. The body mass index (BMI) is a screening tool to identify possible weight problems. It provides an estimate of body fat based on height and weight. Your health care provider can find your BMI and can help you achieve or maintain a healthy weight.For adults 20 years and older:  A BMI below 18.5 is considered underweight.  A BMI of 18.5 to 24.9 is normal.  A BMI of 25 to 29.9 is considered  overweight.  A BMI of 30 and above is considered obese.  Maintain normal blood lipids and cholesterol levels by exercising and minimizing your intake of saturated fat. Eat a balanced diet with plenty of fruit and vegetables. Blood tests for lipids and cholesterol should begin at age 22 and be repeated every 5 years. If your lipid or cholesterol levels are high, you are over 50, or you are at high risk for heart disease, you may need your cholesterol levels checked more frequently.Ongoing high lipid and cholesterol levels should be treated with medicines if diet and exercise are not working.  If you smoke, find out from your health care provider how to quit. If you do not use tobacco, do not start.  Lung cancer screening is recommended for adults aged 87-80 years who are at high risk for developing lung cancer because of a history of smoking. A yearly low-dose CT scan of the lungs is recommended for people who have at least a 30-pack-year history of smoking and are a current smoker or have quit within the past 15 years. A pack year of smoking is smoking an average of 1 pack of cigarettes a day for 1 year (for example: 1 pack a day for 30 years or 2 packs a day for 15 years). Yearly screening should continue until the smoker has stopped smoking for at least 15 years. Yearly screening should be stopped for people who develop a health problem that would prevent them from having lung cancer treatment.  If you choose to drink alcohol, do not have more  than 2 drinks per day. One drink is considered to be 12 ounces (355 mL) of beer, 5 ounces (148 mL) of wine, or 1.5 ounces (44 mL) of liquor.  Avoid use of street drugs. Do not share needles with anyone. Ask for help if you need support or instructions about stopping the use of drugs.  High blood pressure causes heart disease and increases the risk of stroke. Your blood pressure should be checked at least every 1-2 years. Ongoing high blood pressure should be  treated with medicines, if weight loss and exercise are not effective.  If you are 34-90 years old, ask your health care provider if you should take aspirin to prevent heart disease.  Diabetes screening is done by taking a blood sample to check your blood glucose level after you have not eaten for a certain period of time (fasting). If you are not overweight and you do not have risk factors for diabetes, you should be screened once every 3 years starting at age 35. If you are overweight or obese and you are 70-84 years of age, you should be screened for diabetes every year as part of your cardiovascular risk assessment.  Colorectal cancer can be detected and often prevented. Most routine colorectal cancer screening begins at the age of 18 and continues through age 69. However, your health care provider may recommend screening at an earlier age if you have risk factors for colon cancer. On a yearly basis, your health care provider may provide home test kits to check for hidden blood in the stool. Use of a small camera at the end of a tube to directly examine the colon (sigmoidoscopy or colonoscopy) can detect the earliest forms of colorectal cancer. Talk to your health care provider about this at age 71, when routine screening begins. Direct exam of the colon should be repeated every 5-10 years through age 18, unless early forms of precancerous polyps or small growths are found.  People who are at an increased risk for hepatitis B should be screened for this virus. You are considered at high risk for hepatitis B if:  You were born in a country where hepatitis B occurs often. Talk with your health care provider about which countries are considered high risk.  Your parents were born in a high-risk country and you have not received a shot to protect against hepatitis B (hepatitis B vaccine).  You have HIV or AIDS.  You use needles to inject street drugs.  You live with, or have sex with, someone who  has hepatitis B.  You are a man who has sex with other men (MSM).  You get hemodialysis treatment.  You take certain medicines for conditions such as cancer, organ transplantation, and autoimmune conditions.  Hepatitis C blood testing is recommended for all people born from [birth year] through 1965 and any individual with known risks for hepatitis C.  Practice safe sex. Use condoms and avoid high-risk sexual practices to reduce the spread of sexually transmitted infections (STIs). STIs include gonorrhea, chlamydia, syphilis, trichomonas, herpes, HPV, and human immunodeficiency virus (HIV). Herpes, HIV, and HPV are viral illnesses that have no cure. They can result in disability, cancer, and death.  If you are a man who has sex with other men, you should be screened at least once per year for:  HIV.  Urethral, rectal, and pharyngeal infection of gonorrhea, chlamydia, or both.  If you are at risk of being infected with HIV, it is recommended that you take a  prescription medicine daily to prevent HIV infection. This is called preexposure prophylaxis (PrEP). You are considered at risk if:  You are a man who has sex with other men (MSM) and have other risk factors.  You are a heterosexual man, are sexually active, and are at increased risk for HIV infection.  You take drugs by injection.  You are sexually active with a partner who has HIV.  Talk with your health care provider about whether you are at high risk of being infected with HIV. If you choose to begin PrEP, you should first be tested for HIV. You should then be tested every 3 months for as long as you are taking PrEP.  A one-time screening for abdominal aortic aneurysm (AAA) and surgical repair of large AAAs by ultrasound are recommended for men ages 44 to 66 years who are current or former smokers.  Healthy men should no longer receive prostate-specific antigen (PSA) blood tests as part of routine cancer screening. Talk with your health  care provider about prostate cancer screening.  Testicular cancer screening is not recommended for adult males who have no symptoms. Screening includes self-exam, a health care provider exam, and other screening tests. Consult with your health care provider about any symptoms you have or any concerns you have about testicular cancer.  Use sunscreen. Apply sunscreen liberally and repeatedly throughout the day. You should seek shade when your shadow is shorter than you. Protect yourself by wearing long sleeves, pants, a wide-brimmed hat, and sunglasses year round, whenever you are outdoors.  Once a month, do a whole-body skin exam, using a mirror to look at the skin on your back. Tell your health care provider about new moles, moles that have irregular borders, moles that are larger than a pencil eraser, or moles that have changed in shape or color.  Stay current with required vaccines (immunizations).  Influenza vaccine. All adults should be immunized every year.  Tetanus, diphtheria, and acellular pertussis (Td, Tdap) vaccine. An adult who has not previously received Tdap or who does not know his vaccine status should receive 1 dose of Tdap. This initial dose should be followed by tetanus and diphtheria toxoids (Td) booster doses every 10 years. Adults with an unknown or incomplete history of completing a 3-dose immunization series with Td-containing vaccines should begin or complete a primary immunization series including a Tdap dose. Adults should receive a Td booster every 10 years.  Varicella vaccine. An adult without evidence of immunity to varicella should receive 2 doses or a second dose if he has previously received 1 dose.  Human papillomavirus (HPV) vaccine. Males aged 11-21 years who have not received the vaccine previously should receive the 3-dose series. Males aged 22-26 years may be immunized. Immunization is recommended through the age of 23 years for any male who has sex with males  and did not get any or all doses earlier. Immunization is recommended for any person with an immunocompromised condition through the age of 72 years if he did not get any or all doses earlier. During the 3-dose series, the second dose should be obtained 4-8 weeks after the first dose. The third dose should be obtained 24 weeks after the first dose and 16 weeks after the second dose.  Zoster vaccine. One dose is recommended for adults aged 23 years or older unless certain conditions are present.  Measles, mumps, and rubella (MMR) vaccine. Adults born before [birth year] generally are considered immune to measles and mumps. Adults born in [birth year]  or later should have 1 or more doses of MMR vaccine unless there is a contraindication to the vaccine or there is laboratory evidence of immunity to each of the three diseases. A routine second dose of MMR vaccine should be obtained at least 28 days after the first dose for students attending postsecondary schools, health care workers, or international travelers. People who received inactivated measles vaccine or an unknown type of measles vaccine during 1963-1967 should receive 2 doses of MMR vaccine. People who received inactivated mumps vaccine or an unknown type of mumps vaccine before 1979 and are at high risk for mumps infection should consider immunization with 2 doses of MMR vaccine. Unvaccinated health care workers born before [birth year] who lack laboratory evidence of measles, mumps, or rubella immunity or laboratory confirmation of disease should consider measles and mumps immunization with 2 doses of MMR vaccine or rubella immunization with 1 dose of MMR vaccine.  Pneumococcal 13-valent conjugate (PCV13) vaccine. When indicated, a person who is uncertain of his immunization history and has no record of immunization should receive the PCV13 vaccine. All adults 9 years of age and older should receive this vaccine. An adult aged 69 years or older who has certain medical  conditions and has not been previously immunized should receive 1 dose of PCV13 vaccine. This PCV13 should be followed with a dose of pneumococcal polysaccharide (PPSV23) vaccine. Adults who are at high risk for pneumococcal disease should obtain the PPSV23 vaccine at least 8 weeks after the dose of PCV13 vaccine. Adults older than 32 years of age who have normal immune system function should obtain the PPSV23 vaccine dose at least 1 year after the dose of PCV13 vaccine.  Pneumococcal polysaccharide (PPSV23) vaccine. When PCV13 is also indicated, PCV13 should be obtained first. All adults aged 79 years and older should be immunized. An adult younger than age 43 years who has certain medical conditions should be immunized. Any person who resides in a nursing home or long-term care facility should be immunized. An adult smoker should be immunized. People with an immunocompromised condition and certain other conditions should receive both PCV13 and PPSV23 vaccines. People with human immunodeficiency virus (HIV) infection should be immunized as soon as possible after diagnosis. Immunization during chemotherapy or radiation therapy should be avoided. Routine use of PPSV23 vaccine is not recommended for American Indians, [place] Natives, or people younger than 65 years unless there are medical conditions that require PPSV23 vaccine. When indicated, people who have unknown immunization and have no record of immunization should receive PPSV23 vaccine. One-time revaccination 5 years after the first dose of PPSV23 is recommended for people aged 19-64 years who have chronic kidney failure, nephrotic syndrome, asplenia, or immunocompromised conditions. People who received 1-2 doses of PPSV23 before age 70 years should receive another dose of PPSV23 vaccine at age 79 years or later if at least 5 years have passed since the previous dose. Doses of PPSV23 are not needed for people immunized with PPSV23 at or after age 55  years.  Meningococcal vaccine. Adults with asplenia or persistent complement component deficiencies should receive 2 doses of quadrivalent meningococcal conjugate (MenACWY-D) vaccine. The doses should be obtained at least 2 months apart. Microbiologists working with certain meningococcal bacteria, [employer] recruits, people at risk during an outbreak, and people who travel to or live in countries with a high rate of meningitis should be immunized. A first-year college student up through age 64 years who is living in a residence hall should receive a  dose if he did not receive a dose on or after his 16th birthday. Adults who have certain high-risk conditions should receive one or more doses of vaccine.  Hepatitis A vaccine. Adults who wish to be protected from this disease, have chronic liver disease, work with hepatitis A-infected animals, work in hepatitis A research labs, or travel to or work in countries with a high rate of hepatitis A should be immunized. Adults who were previously unvaccinated and who anticipate close contact with an international adoptee during the first 60 days after arrival in the [place] from a country with a high rate of hepatitis A should be immunized.  Hepatitis B vaccine. Adults should be immunized if they wish to be protected from this disease, are under age 34 years and have diabetes, have chronic liver disease, have had more than one sex partner in the past 6 months, may be exposed to blood or other infectious body fluids, are household contacts or sex partners of hepatitis B positive people, are clients or workers in certain care facilities, or travel to or work in countries with a high rate of hepatitis B.  Haemophilus influenzae type b (Hib) vaccine. A previously unvaccinated person with asplenia or sickle cell disease or having a scheduled splenectomy should receive 1 dose of Hib vaccine. Regardless of previous immunization, a recipient of a hematopoietic stem cell  transplant should receive a 3-dose series 6-12 months after his successful transplant. Hib vaccine is not recommended for adults with HIV infection. Preventive Service / Frequency Ages 77 to 55  Blood pressure check.** / Every 3-5 years.  Lipid and cholesterol check.** / Every 5 years beginning at age 66.  Hepatitis C blood test.** / For any individual with known risks for hepatitis C.  Skin self-exam. / Monthly.  Influenza vaccine. / Every year.  Tetanus, diphtheria, and acellular pertussis (Tdap, Td) vaccine.** / Consult your health care provider. 1 dose of Td every 10 years.  Varicella vaccine.** / Consult your health care provider.  HPV vaccine. / 3 doses over 6 months, if 45 or younger.  Measles, mumps, rubella (MMR) vaccine.** / You need at least 1 dose of MMR if you were born in 1957 or later. You may also need a second dose.  Pneumococcal 13-valent conjugate (PCV13) vaccine.** / Consult your health care provider.  Pneumococcal polysaccharide (PPSV23) vaccine.** / 1 to 2 doses if you smoke cigarettes or if you have certain conditions.  Meningococcal vaccine.** / 1 dose if you are age 81 to 79 years and a Market researcher living in a residence hall, or have one of several medical conditions. You may also need additional booster doses.  Hepatitis A vaccine.** / Consult your health care provider.  Hepatitis B vaccine.** / Consult your health care provider.  Haemophilus influenzae type b (Hib) vaccine.** / Consult your health care provider. Ages 6 to 58  Blood pressure check.** / Every year.  Lipid and cholesterol check.** / Every 5 years beginning at age 89.  Lung cancer screening. / Every year if you are aged 84-80 years and have a 30-pack-year history of smoking and currently smoke or have quit within the past 15 years. Yearly screening is stopped once you have quit smoking for at least 15 years or develop a health problem that would prevent you from having  lung cancer treatment.  Fecal occult blood test (FOBT) of stool. / Every year beginning at age 90 and continuing until age 73. You may not have to do  this test if you get a colonoscopy every 10 years.  Flexible sigmoidoscopy** or colonoscopy.** / Every 5 years for a flexible sigmoidoscopy or every 10 years for a colonoscopy beginning at age 48 and continuing until age 12.  Hepatitis C blood test.** / For all people born from [birth year] through 1965 and any individual with known risks for hepatitis C.  Skin self-exam. / Monthly.  Influenza vaccine. / Every year.  Tetanus, diphtheria, and acellular pertussis (Tdap/Td) vaccine.** / Consult your health care provider. 1 dose of Td every 10 years.  Varicella vaccine.** / Consult your health care provider.  Zoster vaccine.** / 1 dose for adults aged 24 years or older.  Measles, mumps, rubella (MMR) vaccine.** / You need at least 1 dose of MMR if you were born in 1957 or later. You may also need a second dose.  Pneumococcal 13-valent conjugate (PCV13) vaccine.** / Consult your health care provider.  Pneumococcal polysaccharide (PPSV23) vaccine.** / 1 to 2 doses if you smoke cigarettes or if you have certain conditions.  Meningococcal vaccine.** / Consult your health care provider.  Hepatitis A vaccine.** / Consult your health care provider.  Hepatitis B vaccine.** / Consult your health care provider.  Haemophilus influenzae type b (Hib) vaccine.** / Consult your health care provider. Ages 51 and over  Blood pressure check.** / Every year.  Lipid and cholesterol check.**/ Every 5 years beginning at age 49.  Lung cancer screening. / Every year if you are aged 64-80 years and have a 30-pack-year history of smoking and currently smoke or have quit within the past 15 years. Yearly screening is stopped once you have quit smoking for at least 15 years or develop a health problem that would prevent you from having lung cancer treatment.  Fecal  occult blood test (FOBT) of stool. / Every year beginning at age 53 and continuing until age 64. You may not have to do this test if you get a colonoscopy every 10 years.  Flexible sigmoidoscopy** or colonoscopy.** / Every 5 years for a flexible sigmoidoscopy or every 10 years for a colonoscopy beginning at age 55 and continuing until age 6.  Hepatitis C blood test.** / For all people born from [birth year] through 1965 and any individual with known risks for hepatitis C.  Abdominal aortic aneurysm (AAA) screening.** / A one-time screening for ages 47 to 48 years who are current or former smokers.  Skin self-exam. / Monthly.  Influenza vaccine. / Every year.  Tetanus, diphtheria, and acellular pertussis (Tdap/Td) vaccine.** / 1 dose of Td every 10 years.  Varicella vaccine.** / Consult your health care provider.  Zoster vaccine.** / 1 dose for adults aged 63 years or older.  Pneumococcal 13-valent conjugate (PCV13) vaccine.** / 1 dose for all adults aged 41 years and older.  Pneumococcal polysaccharide (PPSV23) vaccine.** / 1 dose for all adults aged 77 years and older.  Meningococcal vaccine.** / Consult your health care provider.  Hepatitis A vaccine.** / Consult your health care provider.  Hepatitis B vaccine.** / Consult your health care provider.  Haemophilus influenzae type b (Hib) vaccine.** / Consult your health care provider. **Family history and personal history of risk and conditions may change your health care provider's recommendations.   This information is not intended to replace advice given to you by your health care provider. Make sure you discuss any questions you have with your health care provider.   Document Released: 10/04/2001 Document Revised: 08/29/2014 Document Reviewed: 01/03/2011 Elsevier Interactive Patient Education 2016  [place] refers to food and lifestyle choices that are based on the traditions of  countries located on the [employer]. This way of eating has been shown to help prevent certain conditions and improve outcomes for people who have chronic diseases, like kidney disease and heart disease. What are tips for following this plan? Lifestyle  Cook and eat meals together with your family, when possible.  Drink enough fluid to keep your urine clear or pale yellow.  Be physically active every day. This includes: ? Aerobic exercise like running or swimming. ? Leisure activities like gardening, walking, or housework.  Get 7-8 hours of sleep each night.  If recommended by your health care provider, drink red wine in moderation. This means 1 glass a day for nonpregnant women and 2 glasses a day for men. A glass of wine equals 5 oz (150 mL). Reading food labels  Check the serving size of packaged foods. For foods such as rice and pasta, the serving size refers to the amount of cooked product, not dry.  Check the total fat in packaged foods. Avoid foods that have saturated fat or trans fats.  Check the ingredients list for added sugars, such as corn syrup. Shopping  At the grocery store, buy most of your food from the areas near the walls of the store. This includes: ? Fresh fruits and vegetables (produce). ? Grains, beans, nuts, and seeds. Some of these may be available in unpackaged forms or large amounts (in bulk). ? Fresh seafood. ? Poultry and eggs. ? Low-fat dairy products.  Buy whole ingredients instead of prepackaged foods.  Buy fresh fruits and vegetables in-season from local farmers markets.  Buy frozen fruits and vegetables in resealable bags.  If you do not have access to quality fresh seafood, buy precooked frozen shrimp or canned fish, such as tuna, salmon, or sardines.  Buy small amounts of raw or cooked vegetables, salads, or olives from the deli or salad bar at your store.  Stock your pantry so you always have certain foods on hand, such as olive  oil, canned tuna, canned tomatoes, rice, pasta, and beans. Cooking  Cook foods with extra-virgin olive oil instead of using butter or other vegetable oils.  Have meat as a side dish, and have vegetables or grains as your main dish. This means having meat in small portions or adding small amounts of meat to foods like pasta or stew.  Use beans or vegetables instead of meat in common dishes like chili or lasagna.  Experiment with different cooking methods. Try roasting or broiling vegetables instead of steaming or sauteing them.  Add frozen vegetables to soups, stews, pasta, or rice.  Add nuts or seeds for added healthy fat at each meal. You can add these to yogurt, salads, or vegetable dishes.  Marinate fish or vegetables using olive oil, lemon juice, garlic, and fresh herbs. Meal planning  Plan to eat 1 vegetarian meal one day each week. Try to work up to 2 vegetarian meals, if possible.  Eat seafood 2 or more times a week.  Have healthy snacks readily available, such as: ? Vegetable sticks with hummus. ? [place] yogurt. ? Fruit and nut trail mix.  Eat balanced meals throughout the week. This includes: ? Fruit: 2-3 servings a day ? Vegetables: 4-5 servings a day ? Low-fat dairy: 2 servings a day ? Fish, poultry, or lean meat: 1 serving a day ? Beans and legumes: 2 or more  servings a week ? Nuts and seeds: 1-2 servings a day ? Whole grains: 6-8 servings a day ? Extra-virgin olive oil: 3-4 servings a day  Limit red meat and sweets to only a few servings a month What are my food choices?  Mediterranean diet ? Recommended ? Grains: Whole-grain pasta. Brown rice. Bulgar wheat. Polenta. Couscous. Whole-wheat bread. [place]. ? Vegetables: Artichokes. Beets. Broccoli. Cabbage. Carrots. Eggplant. Green beans. Chard. Kale. Spinach. Onions. Leeks. Peas. Squash. Tomatoes. Peppers. Radishes. ? Fruits: Apples. Apricots. Avocado. Berries. Bananas. Cherries. Dates. Figs. Grapes.  Lemons. Melon. Oranges. Peaches. Plums. Pomegranate. ? Meats and other protein foods: Beans. Almonds. Sunflower seeds. Pine nuts. Peanuts. [place]. Salmon. Scallops. Shrimp. [place]. Tilapia. Clams. Oysters. Eggs. ? Dairy: Low-fat milk. Cheese. Greek yogurt. ? Beverages: Water. Red wine. Herbal tea. ? Fats and oils: Extra virgin olive oil. Avocado oil. Grape seed oil. ? Sweets and desserts: [place] yogurt with honey. Baked apples. Poached pears. Trail mix. ? Seasoning and other foods: Basil. Cilantro. Coriander. Cumin. Mint. Parsley. Sage. Rosemary. Tarragon. Garlic. Oregano. Thyme. Pepper. Balsalmic vinegar. Tahini. Hummus. Tomato sauce. Olives. Mushrooms. ? Limit these ? Grains: Prepackaged pasta or rice dishes. Prepackaged cereal with added sugar. ? Vegetables: Deep fried potatoes (french fries). ? Fruits: Fruit canned in syrup. ? Meats and other protein foods: Beef. Pork. Lamb. Poultry with skin. Hot dogs. Berniece Salines. ? Dairy: Ice cream. Sour cream. Whole milk. ? Beverages: Juice. Sugar-sweetened soft drinks. Beer. Liquor and spirits. ? Fats and oils: Butter. Canola oil. Vegetable oil. Beef fat (tallow). Lard. ? Sweets and desserts: Cookies. Cakes. Pies. Candy. ? Seasoning and other foods: Mayonnaise. Premade sauces and marinades. ? The items listed may not be a complete list. Talk with your dietitian about what dietary choices are right for you. Summary  The Mediterranean diet includes both food and lifestyle choices.  Eat a variety of fresh fruits and vegetables, beans, nuts, seeds, and whole grains.  Limit the amount of red meat and sweets that you eat.  Talk with your health care provider about whether it is safe for you to drink red wine in moderation. This means 1 glass a day for nonpregnant women and 2 glasses a day for men. A glass of wine equals 5 oz (150 mL). This information is not intended to replace advice given to you by your health care provider. Make sure you discuss any questions  you have with your health care provider. Document Released: 03/31/2016 Document Revised: 05/03/2016 Document Reviewed: 03/31/2016 Elsevier Interactive Patient Education  2018 [employer].   Increase water intake, strive for at [employer].   Follow Mediterranean diet Increase regular exercise.  Recommend at least 30 minutes daily, 5 days per week of walking, jogging, biking, swimming, YouTube/Pinterest workout videos. Overall labs look good! Recommend annual physical with fasting labs. Stop tobacco use NICE TO SEE YOU!

## 2018-03-20 NOTE — Progress Notes (Signed)
Subjective:    Patient ID: Henry Brady, male    DOB: [birth year], 32 y.o.   MRN: [id number]  HPI: 12/19/17 OV: Mr. Foerster is here to establish as a new pt.   He is a pleasant 32 year old male.  PMH: Asthma, initially dx'd at age 65 or 21. He estimates that he has asthma sx's 2 days/ week and 1-2 nights/month, mild persistent asthma He has been acutely ill the last three weeks with productive cough (thick/green mucus), wheezing, and chest tightness. He has been needing to use Albuterol several times/daily over the last two weeks He reports smoking cigars, 2-3/day. He denies reports only social ETOH use He is adopted, so he is unaware of any family medical hx His wife is at [facility] during [place]  03/20/18 OV: Mr. Hufnagle is here for CPE He estimates to drink 50 oz water/day and tries to avoid fast food He continues to smoke cigars, 2-3 times/week He remains quite active with work Reviewed recent lab work- Creat high normal 1.22 GFR 90 Wife at [employer] during [place] Maintenance: Colonoscopy-not indicated Immunizations-UTD  Patient Care Team    Relationship Specialty Notifications Start End  Quinterius Gaida, Berna Spare, NP PCP - General Family Medicine  11/10/17     Patient Active Problem List   Diagnosis Date Noted  . Asthma 12/19/2017  . Acute bronchitis 12/19/2017  . Healthcare maintenance 12/19/2017     Past Medical History:  Diagnosis Date  . Asthma      History reviewed. No pertinent surgical history.   Family History  Adopted: Yes     Social History   Substance and Sexual Activity  Drug Use No     Social History   Substance and Sexual Activity  Alcohol Use Yes   Comment: occ     Social History   Tobacco Use  Smoking Status Current Some Day Smoker  . Years: 2.00  . Types: Cigars  Smokeless Tobacco Never Used  Tobacco Comment   smokes 2 cigars per day     Outpatient Encounter Medications as of 03/20/2018  Medication Sig  . albuterol (PROVENTIL  HFA;VENTOLIN HFA) 108 (90 Base) MCG/ACT inhaler Inhale 2 puffs into the lungs every 6 (six) hours as needed for wheezing or shortness of breath.  . fluticasone (FLONASE) 50 MCG/ACT nasal spray Place 2 sprays into both nostrils daily.  Marland Kitchen ibuprofen (ADVIL,MOTRIN) 600 MG tablet Take 1 tablet (600 mg total) by mouth every 6 (six) hours as needed.  . [DISCONTINUED] albuterol (PROVENTIL HFA;VENTOLIN HFA) 108 (90 Base) MCG/ACT inhaler Inhale 2 puffs into the lungs every 6 (six) hours as needed for wheezing or shortness of breath.  . [DISCONTINUED] azithromycin (ZITHROMAX) 250 MG tablet 2 tabs days one. 1 tab days two-five  . [DISCONTINUED] montelukast (SINGULAIR) 10 MG tablet Take 1 tablet (10 mg total) by mouth at bedtime.  . [DISCONTINUED] nicotine (NICODERM CQ - DOSED IN MG/24 HOURS) 14 mg/24hr patch Place 1 patch (14 mg total) onto the skin daily.   No facility-administered encounter medications on file as of 03/20/2018.     Allergies: Patient has no known allergies.  Body mass index is 20.65 kg/m.  Blood pressure 112/65, pulse 80, height 5' 8.75" (1.746 m), weight 138 lb 12.8 oz (63 kg), SpO2 100 %.  Review of Systems  Constitutional: Positive for fatigue. Negative for activity change, appetite change, chills, diaphoresis, fever and unexpected weight change.  HENT: Positive for congestion, postnasal drip, rhinorrhea and sinus pressure. Negative for  voice change.   Eyes: Negative for visual disturbance.  Respiratory: Positive for cough, chest tightness, shortness of breath and wheezing. Negative for stridor.   Cardiovascular: Negative for chest pain, palpitations and leg swelling.  Gastrointestinal: Negative for abdominal distention, abdominal pain, blood in stool, constipation, diarrhea, nausea and vomiting.  Endocrine: Negative for cold intolerance, heat intolerance, polydipsia, polyphagia and polyuria.  Genitourinary: Negative for difficulty urinating and flank pain.  Skin: Negative for  color change, pallor, rash and wound.  Neurological: Negative for dizziness and headaches.  Hematological: Does not bruise/bleed easily.  Psychiatric/Behavioral: Positive for sleep disturbance. Negative for dysphoric mood, hallucinations, self-injury and suicidal ideas. The patient is not nervous/anxious and is not hyperactive.        Objective:   Physical Exam  Constitutional: He is oriented to person, place, and time. He appears well-developed and well-nourished. No distress.  HENT:  Head: Normocephalic and atraumatic.  Right Ear: External ear normal. Tympanic membrane is not erythematous and not bulging. No decreased hearing is noted.  Left Ear: External ear normal. Tympanic membrane is not erythematous and not bulging. No decreased hearing is noted.  Nose: Mucosal edema and rhinorrhea present. Right sinus exhibits no maxillary sinus tenderness and no frontal sinus tenderness. Left sinus exhibits no maxillary sinus tenderness and no frontal sinus tenderness.  Mouth/Throat: Uvula is midline. Posterior oropharyngeal edema and posterior oropharyngeal erythema present. No oropharyngeal exudate or tonsillar abscesses.  Eyes: Pupils are equal, round, and reactive to light. EOM are normal.  Neck: Normal range of motion. Neck supple.  Cardiovascular: Normal rate, regular rhythm, normal heart sounds and intact distal pulses.  No murmur heard. Pulmonary/Chest: Effort normal. No stridor. No respiratory distress. He has no wheezes. He has no rales. He exhibits no tenderness.  Abdominal: Soft. Bowel sounds are normal. He exhibits no distension and no mass. There is no tenderness. There is no rebound and no guarding. No hernia.  Genitourinary:  Genitourinary Comments: Declined DRE/genital exam  Musculoskeletal: Normal range of motion. He exhibits no edema.  Lymphadenopathy:    He has no cervical adenopathy.  Neurological: He is alert and oriented to person, place, and time. Coordination normal.   Skin: Skin is warm and dry. No rash noted. He is not diaphoretic. No erythema. No pallor.  Psychiatric: He has a normal mood and affect. His behavior is normal. Judgment and thought content normal.  Nursing note and vitals reviewed.     Assessment & Plan:   1. Healthcare maintenance   2. Mild persistent asthma with acute exacerbation     Healthcare maintenance Increase water intake, strive for at least75 ounces/day.   Follow Mediterranean diet Increase regular exercise.  Recommend at least 30 minutes daily, 5 days per week of walking, jogging, biking, swimming, YouTube/Pinterest workout videos. Overall labs look good! Recommend annual physical with fasting labs.  Asthma STOP tobacco use Continue Albuterol PRN   FOLLOW-UP:  Return in about 1 year (around 03/21/2019) for CPE, Fasting Labs.

## 2018-03-20 NOTE — Assessment & Plan Note (Signed)
Increase water intake, strive for at least75 ounces/day.   Follow Mediterranean diet Increase regular exercise.  Recommend at least 30 minutes daily, 5 days per week of walking, jogging, biking, swimming, YouTube/Pinterest workout videos. Overall labs look good! Recommend annual physical with fasting labs.

## 2018-03-20 NOTE — Assessment & Plan Note (Signed)
STOP tobacco use Continue Albuterol PRN

## 2018-10-01 ENCOUNTER — Encounter: Payer: Self-pay | Admitting: Adult Health

## 2018-10-01 ENCOUNTER — Ambulatory Visit (INDEPENDENT_AMBULATORY_CARE_PROVIDER_SITE_OTHER): Payer: Managed Care, Other (non HMO) | Admitting: Adult Health

## 2018-10-01 VITALS — BP 122/65 | HR 75 | Temp 99.0°F | Ht 68.75 in | Wt 145.5 lb

## 2018-10-01 DIAGNOSIS — D1722 Benign lipomatous neoplasm of skin and subcutaneous tissue of left arm: Secondary | ICD-10-CM | POA: Diagnosis not present

## 2018-10-01 NOTE — Progress Notes (Signed)
Subjective:    Patient ID: Henry Brady, male    DOB: 1985/12/29, 33 y.o.   MRN: [id number]  HPI:  Henry Brady presents with "knot on my arm" that developed in early August 2010. He denies acute accident/injury prior to "knot" formation. Her denies pain at site. He denies fever/night sweats/chills/unexplained weight loss/malaise/poor appetite. He denies hx of lipomas Henry Brady at [facility] during [place]  Patient Care Team    Relationship Specialty Notifications Start End  Henry Grandchild, NP PCP - General Family Medicine  11/10/17     Patient Active Problem List   Diagnosis Date Noted  . Lipoma of left upper extremity 10/01/2018  . Asthma 12/19/2017  . Acute bronchitis 12/19/2017  . Healthcare maintenance 12/19/2017     Past Medical History:  Diagnosis Date  . Asthma      History reviewed. No pertinent surgical history.   Family History  Adopted: Yes     Social History   Substance and Sexual Activity  Drug Use No     Social History   Substance and Sexual Activity  Alcohol Use Yes   Comment: occ     Social History   Tobacco Use  Smoking Status Current Some Day Smoker  . Years: 2.00  . Types: Cigars  Smokeless Tobacco Never Used  Tobacco Comment   smokes 2 cigars per day     Outpatient Encounter Medications as of 10/01/2018  Medication Sig  . albuterol (PROVENTIL HFA;VENTOLIN HFA) 108 (90 Base) MCG/ACT inhaler Inhale 2 puffs into the lungs every 6 (six) hours as needed for wheezing or shortness of breath.  Marland Kitchen ibuprofen (ADVIL,MOTRIN) 600 MG tablet Take 1 tablet (600 mg total) by mouth every 6 (six) hours as needed.  . [DISCONTINUED] fluticasone (FLONASE) 50 MCG/ACT nasal spray Place 2 sprays into both nostrils daily.   No facility-administered encounter medications on file as of 10/01/2018.     Allergies: Patient has no known allergies.  Body mass index is 21.64 kg/m.  Blood pressure 122/65, pulse 75, temperature 99 F (37.2 C), temperature source Oral,  height 5' 8.75" (1.746 m), weight 145 lb 8 oz (66 kg), SpO2 98 %.  Review of Systems  Constitutional: Negative for activity change, appetite change, chills, diaphoresis, fatigue, fever and unexpected weight change.  Respiratory: Negative for cough, chest tightness, shortness of breath, wheezing and stridor.   Cardiovascular: Negative for chest pain, palpitations and leg swelling.  Musculoskeletal: Negative for arthralgias, back pain, gait problem, joint swelling, myalgias, neck pain and neck stiffness.       "knot on arm"  Skin:       LUE "Knot"  Hematological: Does not bruise/bleed easily.       Objective:   Physical Exam Vitals signs and nursing note reviewed.  Constitutional:      General: He is not in acute distress.    Appearance: He is not ill-appearing, toxic-appearing or diaphoretic.  HENT:     Head: Normocephalic and atraumatic.  Cardiovascular:     Rate and Rhythm: Normal rate.     Pulses: Normal pulses.     Heart sounds: Normal heart sounds. No murmur. No friction rub. No gallop.   Musculoskeletal: Normal range of motion.        General: No swelling, tenderness, deformity or signs of injury.     Left upper arm: He exhibits no tenderness, no bony tenderness, no swelling, no edema, no deformity and no laceration.     Right lower leg: No edema.  Left lower leg: No edema.     Comments: LUE-one mobile,non-tender circular lipoma 1.5cm in diameter -at base of deltoid  Skin:    General: Skin is warm and dry.     Capillary Refill: Capillary refill takes less than 2 seconds.     Coloration: Skin is not jaundiced or pale.     Findings: No bruising, erythema, lesion or rash.  Neurological:     Mental Status: He is alert and oriented to person, place, and time.  Psychiatric:        Mood and Affect: Mood normal.        Behavior: Behavior normal.        Thought Content: Thought content normal.        Judgment: Judgment normal.       Assessment & Plan:   1. Lipoma of  left upper extremity     Lipoma of left upper extremity LUE-one mobile,non-tender circular lipoma 1.5cm in diameter -at base of deltoid If the lipoma grows bigger or become larger please call clinic and we can send you for imaging.    FOLLOW-UP:  Return if symptoms worsen or fail to improve.

## 2018-10-01 NOTE — Assessment & Plan Note (Signed)
LUE-one mobile,non-tender circular lipoma 1.5cm in diameter -at base of deltoid If the lipoma grows bigger or become larger please call clinic and we can send you for imaging.

## 2018-10-01 NOTE — Patient Instructions (Signed)
Lipoma  A lipoma is a noncancerous (benign) tumor that is made up of fat cells. This is a very common type of soft-tissue growth. Lipomas are usually found under the skin (subcutaneous). They may occur in any tissue of the body that contains fat. Common areas for lipomas to appear include the back, shoulders, buttocks, and thighs.  Lipomas grow slowly, and they are usually painless. Most lipomas do not cause problems and do not require treatment. What are the causes? The cause of this condition is not known. What increases the risk? You are more likely to develop this condition if:  You are 75-23 years old.  You have a family history of lipomas. What are the signs or symptoms? A lipoma usually appears as a small, round bump under the skin. In most cases, the lump will:  Feel soft or rubbery.  Not cause pain or other symptoms. However, if a lipoma is located in an area where it pushes on nerves, it can become painful or cause other symptoms. How is this diagnosed? A lipoma can usually be diagnosed with a physical exam. You may also have tests to confirm the diagnosis and to rule out other conditions. Tests may include:  Imaging tests, such as a CT scan or MRI.  Removal of a tissue sample to be looked at under a microscope (biopsy). How is this treated? Treatment for this condition depends on the size of the lipoma and whether it is causing any symptoms.  For small lipomas that are not causing problems, no treatment is needed.  If a lipoma is bigger or it causes problems, surgery may be done to remove the lipoma. Lipomas can also be removed to improve appearance. Most often, the procedure is done after applying a medicine that numbs the area (local anesthetic). Follow these instructions at home:  Watch your lipoma for any changes.  Keep all follow-up visits as told by your health care provider. This is important. Contact a health care provider if:  Your lipoma becomes larger or  hard.  Your lipoma becomes painful, red, or increasingly swollen. These could be signs of infection or a more serious condition. Get help right away if:  You develop tingling or numbness in an area near the lipoma. This could indicate that the lipoma is causing nerve damage. Summary  A lipoma is a noncancerous tumor that is made up of fat cells.  Most lipomas do not cause problems and do not require treatment.  If a lipoma is bigger or it causes problems, surgery may be done to remove the lipoma. This information is not intended to replace advice given to you by your health care provider. Make sure you discuss any questions you have with your health care provider. Document Released: 07/29/2002 Document Revised: 07/25/2017 Document Reviewed: 07/25/2017 Elsevier Interactive Patient Education  [employer].  If the lipoma grows bigger or become larger please call clinic and we can send you for imaging. NICE TO SEE YOU!

## 2018-12-04 ENCOUNTER — Other Ambulatory Visit: Payer: Self-pay

## 2018-12-04 ENCOUNTER — Encounter: Payer: Self-pay | Admitting: Adult Health

## 2018-12-04 ENCOUNTER — Telehealth: Payer: Self-pay | Admitting: Adult Health

## 2018-12-04 ENCOUNTER — Ambulatory Visit (INDEPENDENT_AMBULATORY_CARE_PROVIDER_SITE_OTHER): Payer: Managed Care, Other (non HMO) | Admitting: Adult Health

## 2018-12-04 VITALS — BP 122/71 | HR 65 | Ht 68.75 in | Wt 140.0 lb

## 2018-12-04 DIAGNOSIS — K047 Periapical abscess without sinus: Secondary | ICD-10-CM | POA: Diagnosis not present

## 2018-12-04 MED ORDER — HYDROCODONE-ACETAMINOPHEN 5-325 MG PO TABS
1.0000 | ORAL_TABLET | Freq: Four times a day (QID) | ORAL | 0 refills | Status: AC | PRN
Start: 2018-12-04 — End: 2018-12-07

## 2018-12-04 MED ORDER — CLINDAMYCIN HCL 300 MG PO CAPS: 300.0000 mg | ORAL_CAPSULE | Freq: Four times a day (QID) | ORAL | 0 refills | Status: DC

## 2018-12-04 NOTE — Telephone Encounter (Signed)
Advised pt that he needs WebEx telemedicine visit to evaluate.  Pt expressed understanding and was transferred to front desk to schedule.  Charyl Bigger, CMA

## 2018-12-04 NOTE — Telephone Encounter (Signed)
Patient's wife called states his rt side jaw is swollen (poss tooth abscess,she says dentist office closed) ask if Rx can be called in or if pt can to be seen (if OV required).  --- Forwarding message to medical assistant to contact pt which instruction / advice @ [phone].  --glh

## 2018-12-04 NOTE — Assessment & Plan Note (Signed)
Observations/Objective: No acute distress noted over video-chat Video quality was poor- area in question appeared swollen and red- requested pt to send MyChart photo  Assessment and Plan: [facility] Controlled Substance Database reviewed- no aberrancies noted Hydrocodone/Acetaminophen 5/325mg  PRN Clindamycin 300mg  QID Warm salt water gargles  F/u with Dentist ASAP   Follow Up Instructions: PRN   I discussed the assessment and treatment plan with the patient. The patient was provided an opportunity to ask questions and all were answered. The patient agreed with the plan and demonstrated an understanding of the instructions.   The patient was advised to call back or seek an in-person evaluation if the symptoms worsen or if the condition fails to improve as anticipated.

## 2018-12-04 NOTE — Progress Notes (Addendum)
Virtual Visit via Video Note  I connected with Henry Brady on 12/04/18 at  4:15 PM EDT by a video enabled telemedicine application and verified that I am speaking with the correct person using two identifiers.   I discussed the limitations of evaluation and management by telemedicine and the availability of in person appointments. The patient expressed understanding and agreed to proceed.  History of Present Illness: Henry Brady video-calls in today with complaint of tooth pain and swelling that began 4 days ago- R upper jaw Pain is constant, sharp pain, rated 7/10 He denies trauma to mouth or chipping a tooth Last contact with a dentist was age 64 He denies fever/night sweats/N/V/D He reports difficulty masticating food  He reports using OTC Acetaminophen with only minimal pain relief He denies ABX use in last 90 days  Of Note- was able to see pt via video, unable to hear him He was able to type out responses to questions on video chat Patient Care Team    Relationship Specialty Notifications Start End  Esaw Grandchild, NP PCP - General Family Medicine  11/10/17     Patient Active Problem List   Diagnosis Date Noted  . Lipoma of left upper extremity 10/01/2018  . Asthma 12/19/2017  . Acute bronchitis 12/19/2017  . Healthcare maintenance 12/19/2017     Past Medical History:  Diagnosis Date  . Asthma      History reviewed. No pertinent surgical history.   Family History  Adopted: Yes     Social History   Substance and Sexual Activity  Drug Use No     Social History   Substance and Sexual Activity  Alcohol Use Yes   Comment: occ     Social History   Tobacco Use  Smoking Status Current Some Day Smoker  . Years: 2.00  . Types: Cigars  Smokeless Tobacco Never Used  Tobacco Comment   smokes 2 cigars per day     Outpatient Encounter Medications as of 12/04/2018  Medication Sig  . albuterol (PROVENTIL HFA;VENTOLIN HFA) 108 (90 Base) MCG/ACT inhaler  Inhale 2 puffs into the lungs every 6 (six) hours as needed for wheezing or shortness of breath.  . fluticasone (FLONASE) 50 MCG/ACT nasal spray Place 2 sprays into both nostrils daily.  Marland Kitchen ibuprofen (ADVIL,MOTRIN) 600 MG tablet Take 1 tablet (600 mg total) by mouth every 6 (six) hours as needed.  . montelukast (SINGULAIR) 10 MG tablet Take 10 mg by mouth at bedtime.   No facility-administered encounter medications on file as of 12/04/2018.     Allergies: Patient has no known allergies.  Body mass index is 20.83 kg/m.  Blood pressure 122/71, pulse 65, height 5' 8.75" (1.746 m), weight 140 lb (63.5 kg).    Observations/Objective: No acute distress noted over video-chat Video quality was poor- area in question appeared swollen and red- requested pt to send MyChart photo  Assessment and Plan: [facility] Controlled Substance Database reviewed- no aberrancies noted Hydrocodone/Acetaminophen 5/325mg  PRN Clindamycin 300mg  QID Warm salt water gargles  F/u with Dentist ASAP   Follow Up Instructions: PRN   I discussed the assessment and treatment plan with the patient. The patient was provided an opportunity to ask questions and all were answered. The patient agreed with the plan and demonstrated an understanding of the instructions.   The patient was advised to call back or seek an in-person evaluation if the symptoms worsen or if the condition fails to improve as anticipated.  I provided 22 minutes  of non-face-to-face time during this encounter.   Esaw Grandchild, NP

## 2019-02-07 ENCOUNTER — Ambulatory Visit (INDEPENDENT_AMBULATORY_CARE_PROVIDER_SITE_OTHER): Payer: Managed Care, Other (non HMO) | Admitting: Adult Health

## 2019-02-07 ENCOUNTER — Other Ambulatory Visit: Payer: Self-pay

## 2019-02-07 ENCOUNTER — Encounter: Payer: Self-pay | Admitting: Adult Health

## 2019-02-07 VITALS — BP 131/80 | HR 71 | Temp 98.7°F | Ht 68.0 in | Wt 150.2 lb

## 2019-02-07 DIAGNOSIS — S30860A Insect bite (nonvenomous) of lower back and pelvis, initial encounter: Secondary | ICD-10-CM | POA: Diagnosis not present

## 2019-02-07 DIAGNOSIS — W57XXXA Bitten or stung by nonvenomous insect and other nonvenomous arthropods, initial encounter: Secondary | ICD-10-CM | POA: Diagnosis not present

## 2019-02-07 DIAGNOSIS — D1722 Benign lipomatous neoplasm of skin and subcutaneous tissue of left arm: Secondary | ICD-10-CM | POA: Diagnosis not present

## 2019-02-07 NOTE — Assessment & Plan Note (Signed)
Ultrasound of left arm ordered- has note enlarged since last OV

## 2019-02-07 NOTE — Assessment & Plan Note (Signed)
Since the tick was attached for such a short duration, flat when you removed it, no significant rash is present, and you are not exhibiting any signs/symptoms of tick borne disease- antibiotic therapy is not indicated. If you develop any of symptoms listed above, please call the clinic. Ultrasound of left arm ordered to address the cyst. Continue to social distance and when out in public wear a mask.

## 2019-02-07 NOTE — Progress Notes (Signed)
Subjective:    Patient ID: Henry Brady, male    DOB: May 08, 1986, 33 y.o.   MRN: [id number]  HPI:  Mr. Balandran presents with a tick bite that occurred 48 hrs ago. He estimates that the tick attached at noon on Tuesday, and he scratched his back at 1700 and removed a flat deer tick (he brought the tick in today). Tick is flat He denies fever/night sweats/joint pain/myalgia's/bull's eye rash/N/V/D/poor appetite/HA/increase in fatigue. He reports localized itching at site.  He reports mass on LUE had not increased in size, but will occasionally cause pain in lower L arm.   Patient Care Team    Relationship Specialty Notifications Start End  Esaw Grandchild, NP PCP - General Family Medicine  11/10/17     Patient Active Problem List   Diagnosis Date Noted  . Tick bite of back 02/07/2019  . Dental abscess 12/04/2018  . Lipoma of left upper extremity 10/01/2018  . Asthma 12/19/2017  . Acute bronchitis 12/19/2017  . Healthcare maintenance 12/19/2017     Past Medical History:  Diagnosis Date  . Asthma      History reviewed. No pertinent surgical history.   Family History  Adopted: Yes     Social History   Substance and Sexual Activity  Drug Use No     Social History   Substance and Sexual Activity  Alcohol Use Yes   Comment: occ     Social History   Tobacco Use  Smoking Status Current Some Day Smoker  . Years: 2.00  . Types: Cigars  Smokeless Tobacco Never Used  Tobacco Comment   smokes 2 cigars per day     Outpatient Encounter Medications as of 02/07/2019  Medication Sig  . albuterol (PROVENTIL HFA;VENTOLIN HFA) 108 (90 Base) MCG/ACT inhaler Inhale 2 puffs into the lungs every 6 (six) hours as needed for wheezing or shortness of breath.  . fluticasone (FLONASE) 50 MCG/ACT nasal spray Place 2 sprays into both nostrils daily.  Marland Kitchen ibuprofen (ADVIL,MOTRIN) 600 MG tablet Take 1 tablet (600 mg total) by mouth every 6 (six) hours as needed.  . montelukast  (SINGULAIR) 10 MG tablet Take 10 mg by mouth at bedtime.  . [DISCONTINUED] clindamycin (CLEOCIN) 300 MG capsule Take 1 capsule (300 mg total) by mouth 4 (four) times daily.   No facility-administered encounter medications on file as of 02/07/2019.     Allergies: Patient has no known allergies.  Body mass index is 22.84 kg/m.  Blood pressure 131/80, pulse 71, temperature 98.7 F (37.1 C), temperature source Oral, height 5\' 8"  (1.727 m), weight 150 lb 3.2 oz (68.1 kg), SpO2 98 %.     Review of Systems  Constitutional: Negative for activity change, appetite change, chills, diaphoresis, fatigue, fever and unexpected weight change.  HENT: Negative for congestion.   Respiratory: Negative for cough, chest tightness, shortness of breath, wheezing and stridor.   Cardiovascular: Negative for chest pain, palpitations and leg swelling.  Gastrointestinal: Negative for abdominal distention, anal bleeding, blood in stool, diarrhea, nausea and vomiting.  Endocrine: Negative for cold intolerance, heat intolerance, polydipsia, polyphagia and polyuria.  Genitourinary: Negative for difficulty urinating and flank pain.  Musculoskeletal: Negative for arthralgias, gait problem, joint swelling, myalgias, neck pain and neck stiffness.  Skin: Negative for color change, pallor, rash and wound.  Neurological: Negative for dizziness, weakness and headaches.  Hematological: Negative for adenopathy. Does not bruise/bleed easily.  Psychiatric/Behavioral: Negative for agitation, behavioral problems, confusion, decreased concentration, dysphoric mood, hallucinations, self-injury, sleep disturbance  and suicidal ideas. The patient is not nervous/anxious and is not hyperactive.        Objective:   Physical Exam Vitals signs and nursing note reviewed.  Constitutional:      General: He is not in acute distress.    Appearance: Normal appearance. He is normal weight. He is not ill-appearing, toxic-appearing or  diaphoretic.  HENT:     Head: Normocephalic and atraumatic.  Eyes:     Extraocular Movements: Extraocular movements intact.     Conjunctiva/sclera: Conjunctivae normal.     Pupils: Pupils are equal, round, and reactive to light.  Neck:     Musculoskeletal: Normal range of motion. No muscular tenderness.  Cardiovascular:     Rate and Rhythm: Normal rate.     Pulses: Normal pulses.     Heart sounds: Normal heart sounds. No murmur. No friction rub. No gallop.   Pulmonary:     Effort: Pulmonary effort is normal. No respiratory distress.     Breath sounds: Normal breath sounds. No stridor. No wheezing, rhonchi or rales.  Chest:     Chest wall: No tenderness.  Musculoskeletal:     Left upper arm: He exhibits no tenderness, no bony tenderness, no swelling, no edema, no deformity and no laceration.       Arms:     Comments: LUE: One mobile, circular, soft mass noted distal medial left bicep Non tender to the touch  Lymphadenopathy:     Cervical: No cervical adenopathy.  Skin:    General: Skin is warm and dry.     Capillary Refill: Capillary refill takes less than 2 seconds.     Findings: No erythema or rash.          Comments: Tick bite center low back. Reddened at bite site (0.2cm), no rash, warmth, streaking noted.   Neurological:     Mental Status: He is alert and oriented to person, place, and time.  Psychiatric:        Mood and Affect: Mood normal.        Behavior: Behavior normal.        Thought Content: Thought content normal.        Judgment: Judgment normal.           Assessment & Plan:   1. Lipoma of left upper extremity   2. Tick bite of back, initial encounter     Lipoma of left upper extremity Ultrasound of left arm ordered- has note enlarged since last OV   Tick bite of back Since the tick was attached for such a short duration, flat when you removed it, no significant rash is present, and you are not exhibiting any signs/symptoms of tick borne  disease- antibiotic therapy is not indicated. If you develop any of symptoms listed above, please call the clinic. Ultrasound of left arm ordered to address the cyst. Continue to social distance and when out in public wear a mask.    FOLLOW-UP:  Return if symptoms worsen or fail to improve.

## 2019-02-07 NOTE — Patient Instructions (Signed)
Tick Bite Information, Adult  Ticks are insects that can bite. Most ticks live in shrubs and grassy areas. They climb onto people and animals that go by. Then they bite. Some ticks carry germs that can make you sick. How can I prevent tick bites?  Use an insect repellent that has 20% or higher of the ingredients DEET, picaridin, or IR3535. Put this insect repellent on: ? Bare skin. ? The tops of your boots. ? Your pant legs. ? The ends of your sleeves.  If you use an insect repellent that has the ingredient permethrin, make sure to follow the instructions on the bottle. Treat the following: ? Clothing. ? Supplies. ? Boots. ? Tents.  Wear long sleeves, long pants, and light colors.  Tuck your pant legs into your socks.  Stay in the middle of the trail.  Try not to walk through long grass.  Before going inside your house, check your clothes, hair, and skin for ticks. Make sure to check your head, neck, armpits, waist, groin, and joint areas.  Check for ticks every day.  When you come indoors: ? Wash your clothes right away. ? Shower right away. ? Dry your clothes in a dryer on high heat for 60 minutes or more. What is the right way to remove a tick? Remove a tick from your skin as soon as possible.  To remove a tick that is crawling on your skin: ? Go outdoors and brush the tick off. ? Use tape or a lint roller.  To remove a tick that is biting: ? Wash your hands. ? If you have latex gloves, put them on. ? Use tweezers, curved forceps, or a tick-removal tool to grasp the tick. Grasp the tick as close to your skin and as close to the tick's head as possible. ? Gently pull up until the tick lets go.  Try to keep the tick's head attached to its body.  Do not twist or jerk the tick.  Do not squeeze or crush the tick. Do not try to remove a tick with heat, alcohol, petroleum jelly, or fingernail polish. How should I get rid of a tick? Here are some ways to get rid of a  tick that is alive:  Place the tick in rubbing alcohol.  Place the tick in a bag or container you can close tightly.  Wrap the tick tightly in tape.  Flush the tick down the toilet. Contact a doctor if:  You have symptoms of a disease, such as: ? Pain in a muscle, joint, or bone. ? Trouble walking or moving your legs. ? Numbness in your legs. ? Inability to move (paralysis). ? A red rash that makes a circle (bull's-eye rash). ? Redness and swelling where the tick bit you. ? A fever. ? Throwing up (vomiting) over and over. ? Diarrhea. ? Weight loss. ? Tender and swollen lymph glands. ? Shortness of breath. ? Cough. ? Belly pain (abdominal pain). ? Headache. ? Being more tired than normal. ? A change in how alert (conscious) you are. ? Confusion. Get help right away if:  You cannot remove a tick.  A part of a tick breaks off and gets stuck in your skin.  You are feeling worse. Summary  Ticks may carry germs that can make you sick.  To prevent tick bites, wear long sleeves, long pants, and light colors. Use insect repellent. Follow the instructions on the bottle.  If the tick is biting, do not try to remove   it with heat, alcohol, petroleum jelly, or fingernail polish.  Use tweezers, curved forceps, or a tick-removal tool to grasp the tick. Gently pull up until the tick lets go. Do not twist or jerk the tick. Do not squeeze or crush the tick.  If you have symptoms, contact a doctor. This information is not intended to replace advice given to you by your health care provider. Make sure you discuss any questions you have with your health care provider. Document Released: 11/02/2009 Document Revised: 11/18/2016 Document Reviewed: 11/18/2016 Elsevier Interactive Patient Education  2019 [employer].   Since the tick was attached for such a short duration, flat when you removed it, no significant rash is present, and you are not exhibiting any signs/symptoms of tick borne  disease- antibiotic therapy is not indicated. If you develop any of symptoms listed above, please call the clinic. Ultrasound of left arm ordered to address the cyst. Continue to social distance and when out in public wear a mask. GREAT TO SEE YOU!

## 2019-03-14 ENCOUNTER — Other Ambulatory Visit: Payer: Self-pay

## 2019-03-14 ENCOUNTER — Other Ambulatory Visit: Payer: Managed Care, Other (non HMO)

## 2019-03-14 DIAGNOSIS — Z0000 Encounter for general adult medical examination without abnormal findings: Secondary | ICD-10-CM

## 2019-03-14 NOTE — Progress Notes (Signed)
Subjective:    Patient ID: Henry Brady, male    DOB: 02-11-1986, 33 y.o.   MRN: [id number]  HPI: Henry Brady is here for CPE He estimates to drink 4-5 16 oz water bottles/day He reports that his urine is pale yellow, denies flank pain He remains active at work and yard work He continues to abstain from tobacco/vape/ETOH use He has not completed the LUE [place] to address LUE mass, possible lipoma   72/23/2020 Labs: Lipid Panel- Tot-184 TGs- 55  HDL-71 LDL-102 A1c-WNL, 5.0 CMP-elevated creat, slightly elevated Na+ CBC-stable  Healthcare Maintenance: Immunizations-UTD  Patient Care Team    Relationship Specialty Notifications Start End  Danford, Berna Spare, NP PCP - General Family Medicine  11/10/17     Patient Active Problem List   Diagnosis Date Noted  . Tick bite of back 02/07/2019  . Dental abscess 12/04/2018  . Lipoma of left upper extremity 10/01/2018  . Asthma 12/19/2017  . Acute bronchitis 12/19/2017  . Healthcare maintenance 12/19/2017     Past Medical History:  Diagnosis Date  . Asthma      History reviewed. No pertinent surgical history.   Family History  Adopted: Yes     Social History   Substance and Sexual Activity  Drug Use No     Social History   Substance and Sexual Activity  Alcohol Use Yes   Comment: occ     Social History   Tobacco Use  Smoking Status Current Some Day Smoker  . Years: 2.00  . Types: Cigars  Smokeless Tobacco Never Used  Tobacco Comment   smokes 2 cigars per day     Outpatient Encounter Medications as of 03/18/2019  Medication Sig  . albuterol (VENTOLIN HFA) 108 (90 Base) MCG/ACT inhaler Inhale 2 puffs into the lungs every 6 (six) hours as needed for wheezing or shortness of breath.  . fluticasone (FLONASE) 50 MCG/ACT nasal spray Place 2 sprays into both nostrils daily.  Marland Kitchen ibuprofen (ADVIL,MOTRIN) 600 MG tablet Take 1 tablet (600 mg total) by mouth every 6 (six) hours as needed.  . [DISCONTINUED] albuterol  (PROVENTIL HFA;VENTOLIN HFA) 108 (90 Base) MCG/ACT inhaler Inhale 2 puffs into the lungs every 6 (six) hours as needed for wheezing or shortness of breath.  . [DISCONTINUED] fluticasone (FLONASE) 50 MCG/ACT nasal spray Place 2 sprays into both nostrils daily.  . [DISCONTINUED] montelukast (SINGULAIR) 10 MG tablet Take 10 mg by mouth at bedtime.   No facility-administered encounter medications on file as of 03/18/2019.     Allergies: Patient has no known allergies.  Body mass index is 22.11 kg/m.  Blood pressure 104/66, pulse 65, temperature 98.3 F (36.8 C), temperature source Oral, height 5\' 8"  (1.727 m), weight 145 lb 6.4 oz (66 kg), SpO2 100 %.     Review of Systems  Constitutional: Negative for activity change, appetite change, chills, diaphoresis, fatigue, fever and unexpected weight change.  HENT: Negative for congestion.   Eyes: Negative for visual disturbance.  Respiratory: Negative for cough, chest tightness, shortness of breath, wheezing and stridor.   Cardiovascular: Negative for chest pain, palpitations and leg swelling.  Gastrointestinal: Negative for abdominal distention, anal bleeding, blood in stool, constipation, diarrhea, nausea and vomiting.  Endocrine: Negative for cold intolerance, heat intolerance, polydipsia, polyphagia and polyuria.  Genitourinary: Negative for difficulty urinating and flank pain.  Musculoskeletal: Negative for arthralgias, back pain, gait problem, joint swelling, myalgias, neck pain and neck stiffness.  Skin: Negative for color change, pallor, rash and wound.  Neurological:  Negative for dizziness and headaches.  Hematological: Negative for adenopathy. Does not bruise/bleed easily.  Psychiatric/Behavioral: Negative for agitation, confusion, decreased concentration, dysphoric mood, hallucinations, self-injury, sleep disturbance and suicidal ideas. The patient is not nervous/anxious and is not hyperactive.        Objective:   Physical Exam  Vitals signs and nursing note reviewed.  Constitutional:      General: He is not in acute distress.    Appearance: Normal appearance. He is normal weight. He is not ill-appearing, toxic-appearing or diaphoretic.  HENT:     Head: Normocephalic and atraumatic.     Right Ear: Tympanic membrane, ear canal and external ear normal. There is no impacted cerumen.     Left Ear: Tympanic membrane, ear canal and external ear normal. There is no impacted cerumen.     Nose: Nose normal. No congestion.     Mouth/Throat:     Mouth: Mucous membranes are moist.     Pharynx: No oropharyngeal exudate.  Eyes:     Extraocular Movements: Extraocular movements intact.     Conjunctiva/sclera: Conjunctivae normal.     Pupils: Pupils are equal, round, and reactive to light.  Neck:     Musculoskeletal: Neck supple. No muscular tenderness.  Cardiovascular:     Rate and Rhythm: Normal rate and regular rhythm.     Pulses: Normal pulses.     Heart sounds: Normal heart sounds. No murmur. No friction rub. No gallop.   Pulmonary:     Effort: Pulmonary effort is normal. No respiratory distress.     Breath sounds: Normal breath sounds. No stridor. No wheezing, rhonchi or rales.  Chest:     Chest wall: No tenderness.  Abdominal:     General: Abdomen is flat. Bowel sounds are normal. There is no distension.     Palpations: Abdomen is soft. There is no mass.     Tenderness: There is no abdominal tenderness. There is no right CVA tenderness, left CVA tenderness, guarding or rebound.     Hernia: No hernia is present.  Musculoskeletal: Normal range of motion.       Arms:     Comments: LUE- one, mobile, soft mass   Skin:    General: Skin is warm and dry.     Capillary Refill: Capillary refill takes less than 2 seconds.  Neurological:     Mental Status: He is alert and oriented to person, place, and time.  Psychiatric:        Mood and Affect: Mood normal.        Behavior: Behavior normal.        Thought Content:  Thought content normal.        Judgment: Judgment normal.       Assessment & Plan:   1. Healthcare maintenance   2. Lipoma of left upper extremity     Healthcare maintenance Overall you are doing a great job taking cafe of yourself. Remain well hydrated, follow heart healthy diet. Please schedule lab appt in 4 weeks, re: re-check CMP for slightly elevated creatinine level. Please schedule Ultrasound for left upper arm mass. Continue to social distance and wear a mask when in public. Recommend annual physical with fasting labs the week prior.  Lipoma of left upper extremity Please schedule Ultrasound for left upper arm mass.    FOLLOW-UP:  Return in about 4 weeks (around 04/15/2019) for Re-Check CMP.

## 2019-03-15 LAB — COMPREHENSIVE METABOLIC PANEL
ALT: 7 IU/L (ref 0–44)
AST: 30 IU/L (ref 0–40)
Albumin/Globulin Ratio: 1.6 (ref 1.2–2.2)
Albumin: 4.6 g/dL (ref 4.0–5.0)
Alkaline Phosphatase: 66 IU/L (ref 39–117)
BUN/Creatinine Ratio: 12 (ref 9–20)
BUN: 16 mg/dL (ref 6–20)
Bilirubin Total: 0.4 mg/dL (ref 0.0–1.2)
CO2: 23 mmol/L (ref 20–29)
Calcium: 9.4 mg/dL (ref 8.7–10.2)
Chloride: 103 mmol/L (ref 96–106)
Creatinine, Ser: 1.36 mg/dL — ABNORMAL HIGH (ref 0.76–1.27)
GFR calc Af Amer: 78 mL/min/{1.73_m2} (ref >59)
GFR calc non Af Amer: 68 mL/min/{1.73_m2} (ref >59)
Globulin, Total: 2.9 g/dL (ref 1.5–4.5)
Glucose: 92 mg/dL (ref 65–99)
Potassium: 5 mmol/L (ref 3.5–5.2)
Sodium: 145 mmol/L — ABNORMAL HIGH (ref 134–144)
Total Protein: 7.5 g/dL (ref 6.0–8.5)

## 2019-03-15 LAB — CBC WITH DIFFERENTIAL/PLATELET
Basophils Absolute: 0.1 10*3/uL (ref 0.0–0.2)
Basos: 1 %
EOS (ABSOLUTE): 0.3 10*3/uL (ref 0.0–0.4)
Eos: 6 %
Hematocrit: 47.9 % (ref 37.5–51.0)
Hemoglobin: 16.2 g/dL (ref 13.0–17.7)
Immature Grans (Abs): 0 10*3/uL (ref 0.0–0.1)
Immature Granulocytes: 0 %
Lymphocytes Absolute: 1.3 10*3/uL (ref 0.7–3.1)
Lymphs: 29 %
MCH: 30 pg (ref 26.6–33.0)
MCHC: 33.8 g/dL (ref 31.5–35.7)
MCV: 89 fL (ref 79–97)
Monocytes Absolute: 0.5 10*3/uL (ref 0.1–0.9)
Monocytes: 10 %
Neutrophils Absolute: 2.4 10*3/uL (ref 1.4–7.0)
Neutrophils: 54 %
Platelets: 205 10*3/uL (ref 150–450)
RBC: 5.4 x10E6/uL (ref 4.14–5.80)
RDW: 12.6 % (ref 11.6–15.4)
WBC: 4.5 10*3/uL (ref 3.4–10.8)

## 2019-03-15 LAB — HEMOGLOBIN A1C
Est. average glucose Bld gHb Est-mCnc: 97 mg/dL
Hgb A1c MFr Bld: 5 % (ref 4.8–5.6)

## 2019-03-15 LAB — LIPID PANEL
Chol/HDL Ratio: 2.6 ratio (ref 0.0–5.0)
Cholesterol, Total: 184 mg/dL (ref 100–199)
HDL: 71 mg/dL (ref >39)
LDL Calculated: 102 mg/dL — ABNORMAL HIGH (ref 0–99)
Triglycerides: 55 mg/dL (ref 0–149)
VLDL Cholesterol Cal: 11 mg/dL (ref 5–40)

## 2019-03-15 LAB — TSH: TSH: 0.573 u[IU]/mL (ref 0.450–4.500)

## 2019-03-18 ENCOUNTER — Ambulatory Visit (INDEPENDENT_AMBULATORY_CARE_PROVIDER_SITE_OTHER): Payer: Managed Care, Other (non HMO) | Admitting: Adult Health

## 2019-03-18 ENCOUNTER — Other Ambulatory Visit: Payer: Self-pay

## 2019-03-18 ENCOUNTER — Encounter: Payer: Self-pay | Admitting: Adult Health

## 2019-03-18 DIAGNOSIS — Z0000 Encounter for general adult medical examination without abnormal findings: Secondary | ICD-10-CM

## 2019-03-18 DIAGNOSIS — D1722 Benign lipomatous neoplasm of skin and subcutaneous tissue of left arm: Secondary | ICD-10-CM | POA: Diagnosis not present

## 2019-03-18 MED ORDER — FLUTICASONE PROPIONATE 50 MCG/ACT NA SUSP: 2.0000 | Freq: Every day | NASAL | 1 refills | Status: DC

## 2019-03-18 MED ORDER — ALBUTEROL SULFATE HFA 108 (90 BASE) MCG/ACT IN AERS: 2.0000 | INHALATION_SPRAY | Freq: Four times a day (QID) | RESPIRATORY_TRACT | 3 refills | Status: DC | PRN

## 2019-03-18 NOTE — Assessment & Plan Note (Signed)
Overall you are doing a great job taking cafe of yourself. Remain well hydrated, follow heart healthy diet. Please schedule lab appt in 4 weeks, re: re-check CMP for slightly elevated creatinine level. Please schedule Ultrasound for left upper arm mass. Continue to social distance and wear a mask when in public. Recommend annual physical with fasting labs the week prior.

## 2019-03-18 NOTE — Patient Instructions (Addendum)

## 2019-03-18 NOTE — Assessment & Plan Note (Signed)
Please schedule Ultrasound for left upper arm mass.

## 2019-03-21 ENCOUNTER — Encounter: Payer: Managed Care, Other (non HMO) | Admitting: Adult Health

## 2019-03-27 ENCOUNTER — Ambulatory Visit
Admission: RE | Admit: 2019-03-27 | Discharge: 2019-03-27 | Disposition: A | Discharge status: Home / Self Care | Payer: Managed Care, Other (non HMO) | Source: Ambulatory Visit | Attending: Adult Health | Admitting: Adult Health

## 2019-03-27 DIAGNOSIS — D1722 Benign lipomatous neoplasm of skin and subcutaneous tissue of left arm: Secondary | ICD-10-CM

## 2019-04-17 ENCOUNTER — Other Ambulatory Visit: Payer: Self-pay

## 2019-04-17 DIAGNOSIS — R7989 Other specified abnormal findings of blood chemistry: Secondary | ICD-10-CM

## 2019-04-18 ENCOUNTER — Other Ambulatory Visit: Payer: Self-pay

## 2019-04-18 ENCOUNTER — Other Ambulatory Visit: Payer: Managed Care, Other (non HMO)

## 2019-04-18 DIAGNOSIS — R7989 Other specified abnormal findings of blood chemistry: Secondary | ICD-10-CM

## 2019-04-19 ENCOUNTER — Encounter: Payer: Self-pay | Admitting: Adult Health

## 2019-04-19 LAB — COMPREHENSIVE METABOLIC PANEL
ALT: 7 IU/L (ref 0–44)
AST: 24 IU/L (ref 0–40)
Albumin/Globulin Ratio: 2.2 (ref 1.2–2.2)
Albumin: 4.8 g/dL (ref 4.0–5.0)
Alkaline Phosphatase: 70 IU/L (ref 39–117)
BUN/Creatinine Ratio: 9 (ref 9–20)
BUN: 11 mg/dL (ref 6–20)
Bilirubin Total: 0.3 mg/dL (ref 0.0–1.2)
CO2: 23 mmol/L (ref 20–29)
Calcium: 9.1 mg/dL (ref 8.7–10.2)
Chloride: 101 mmol/L (ref 96–106)
Creatinine, Ser: 1.24 mg/dL (ref 0.76–1.27)
GFR calc Af Amer: 88 mL/min/{1.73_m2} (ref >59)
GFR calc non Af Amer: 76 mL/min/{1.73_m2} (ref >59)
Globulin, Total: 2.2 g/dL (ref 1.5–4.5)
Glucose: 93 mg/dL (ref 65–99)
Potassium: 4.2 mmol/L (ref 3.5–5.2)
Sodium: 139 mmol/L (ref 134–144)
Total Protein: 7 g/dL (ref 6.0–8.5)

## 2020-02-29 ENCOUNTER — Ambulatory Visit: Payer: Managed Care, Other (non HMO) | Attending: Internal Medicine

## 2020-02-29 DIAGNOSIS — Z23 Encounter for immunization: Secondary | ICD-10-CM

## 2020-02-29 NOTE — Progress Notes (Signed)
° °  Covid-19 Vaccination Clinic  Name:  Henry Brady    MRN: [id number] DOB: 25-Mar-1986  02/29/2020  [name] was observed post Covid-19 immunization for 15 minutes without incident. He was provided with Vaccine Information Sheet and instruction to access the V-Safe system.   [name] was instructed to call 911 with any severe reactions post vaccine:  Difficulty breathing   Swelling of face and throat   A fast heartbeat   A bad rash all over body   Dizziness and weakness   Immunizations Administered    Name Date Dose VIS Date Route   Pfizer COVID-19 Vaccine 02/29/2020  9:31 AM 0.3 mL 10/16/2018 Intramuscular   Manufacturer: [place]   Lot: [id number]   [place]: [address]-7

## 2020-03-10 ENCOUNTER — Other Ambulatory Visit: Payer: Self-pay

## 2020-03-10 ENCOUNTER — Ambulatory Visit (HOSPITAL_COMMUNITY)
Admission: EM | Admit: 2020-03-10 | Discharge: 2020-03-10 | Disposition: A | Discharge status: Home / Self Care | Payer: BC Managed Care – PPO | Attending: Family Medicine | Admitting: Family Medicine

## 2020-03-10 ENCOUNTER — Encounter (HOSPITAL_COMMUNITY): Payer: Self-pay | Admitting: Emergency Medicine

## 2020-03-10 DIAGNOSIS — R109 Unspecified abdominal pain: Secondary | ICD-10-CM

## 2020-03-10 NOTE — Discharge Instructions (Signed)
See your PCP

## 2020-03-10 NOTE — ED Triage Notes (Signed)
PT reports he has intermittent groin pain/ swelling. When this occurs his testicles are sore to touch. He stoops over to relieve pain, pain is also relieved by sitting.   He works two jobs and both are physical. He had an abdominal hernia as a child that he was told self-resolved.

## 2020-03-10 NOTE — ED Provider Notes (Signed)
Patient presented for evaluation for groin pain.  While he was in the exam room waiting to be seen he received a call from his wife that she had made him an appointment today with his PCP.  He requested to be excused for this appointment.  He was not examined.   Raylene Everts, MD 03/10/20 [phone]

## 2020-03-21 ENCOUNTER — Ambulatory Visit: Payer: BC Managed Care – PPO | Attending: Internal Medicine

## 2020-03-21 DIAGNOSIS — Z23 Encounter for immunization: Secondary | ICD-10-CM

## 2020-03-21 NOTE — Progress Notes (Signed)
   Covid-19 Vaccination Clinic  Name:  Henry Brady    MRN: [id number] DOB: [birth year]  03/21/2020  Henry Brady was observed post Covid-19 immunization for 15 minutes without incident. He was provided with Vaccine Information Sheet and instruction to access the V-Safe system.   Henry Brady was instructed to call 911 with any severe reactions post vaccine: Marland Kitchen Difficulty breathing  . Swelling of face and throat  . A fast heartbeat  . A bad rash all over body  . Dizziness and weakness   Immunizations Administered    Name Date Dose VIS Date Route   Pfizer COVID-19 Vaccine 03/21/2020  9:16 AM 0.3 mL 10/16/2018 Intramuscular   Manufacturer: [employer]   Lot: [id number]   [place]: [address]-7

## 2020-04-15 ENCOUNTER — Encounter: Payer: BC Managed Care – PPO | Admitting: Physician Assistant

## 2020-06-07 IMAGING — US LEFT UPPER EXTREMITY SOFT TISSUE ULTRASOUND LIMITED
1 series · 8 of 8 positions shown · non-contrast
Comparison: Non

CLINICAL DATA: Left upper extremity mass

EXAM:
ULTRASOUND left UPPER EXTREMITY LIMITED
TECHNIQUE: Ultrasound examination of the upper extremity soft tissues was
performed in the area of clinical concern.

[Series 1: left upper extremity soft tissue ultrasound limite · 0.04mm/px · 8 of 8 slices shown]
[im 1/8]
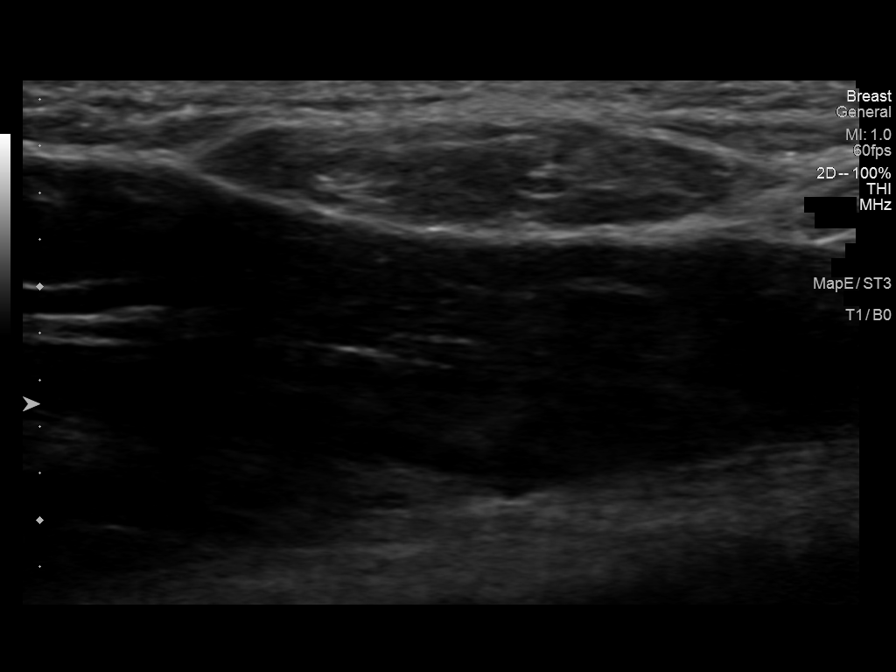
[im 2/8]
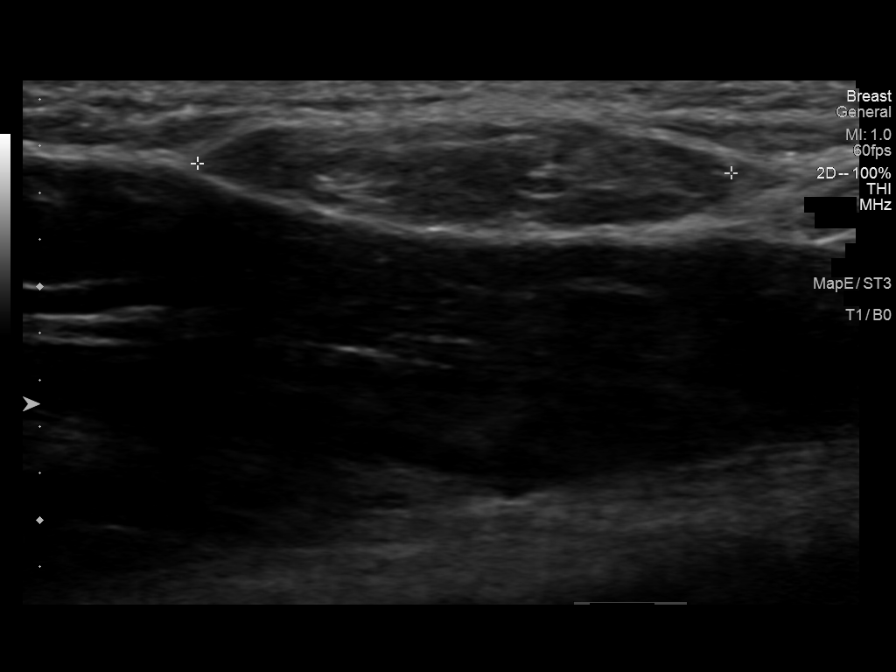
[im 3/8]
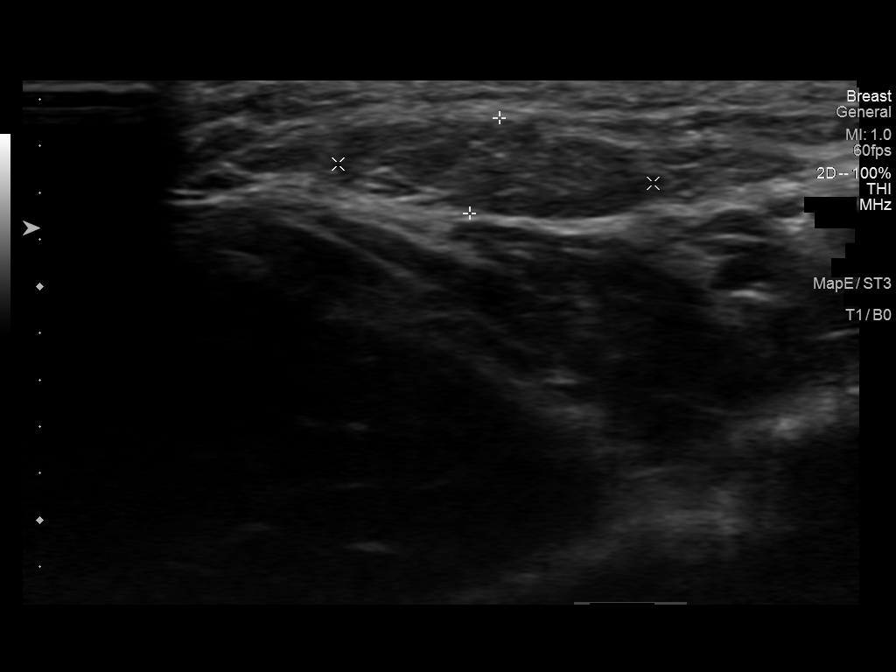
[im 4/8]
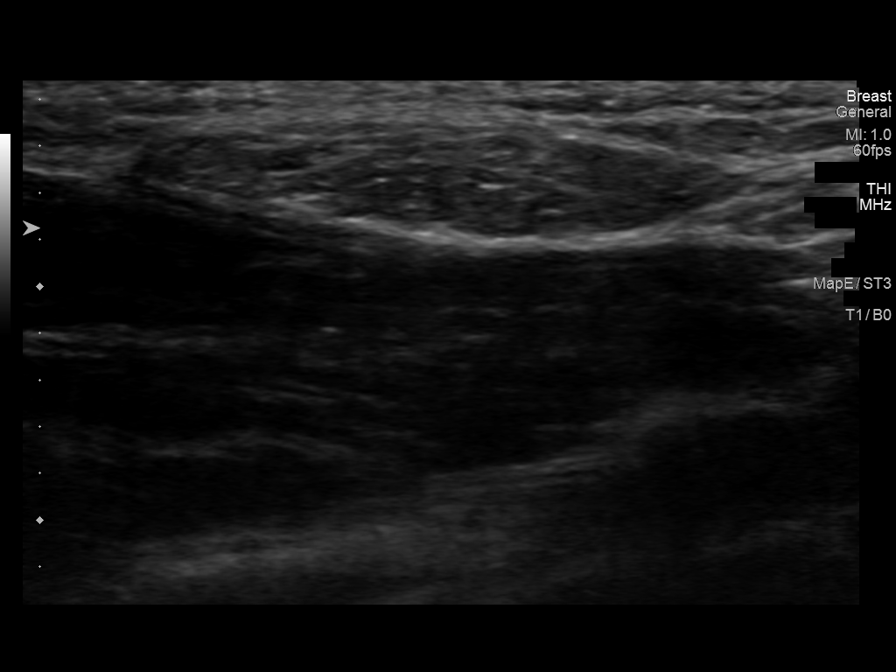
[im 5/8]
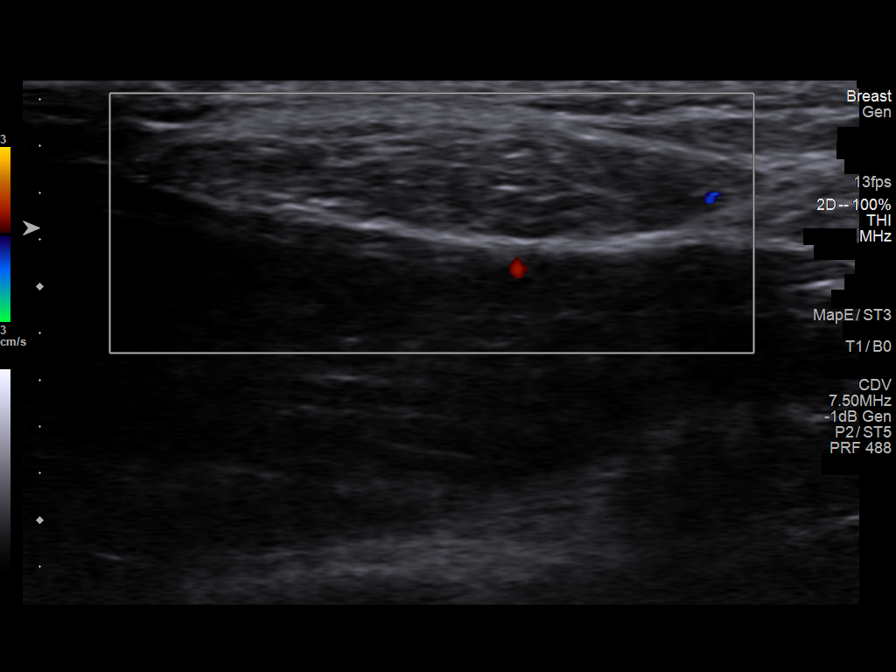
[im 6/8]
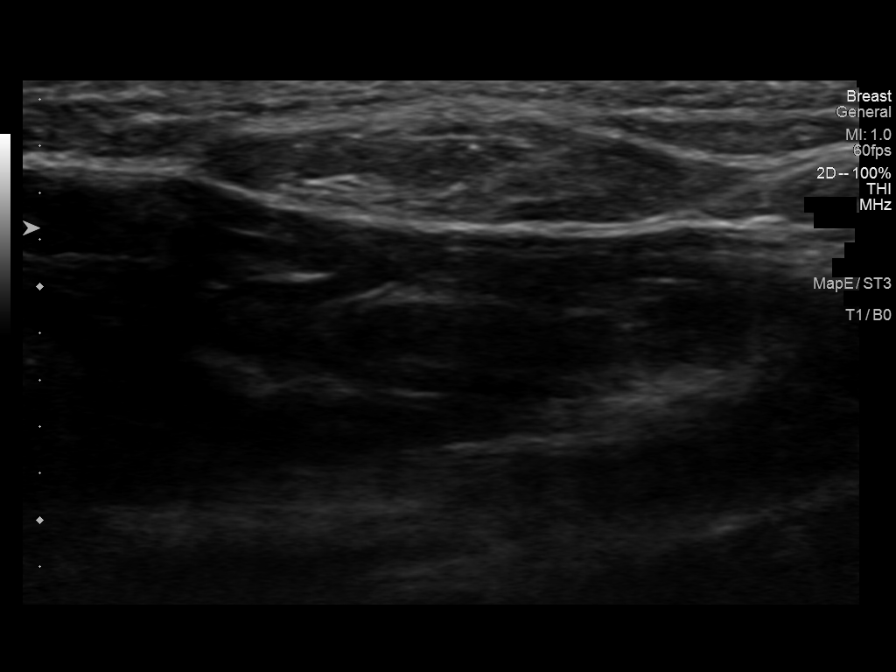
[im 7/8]
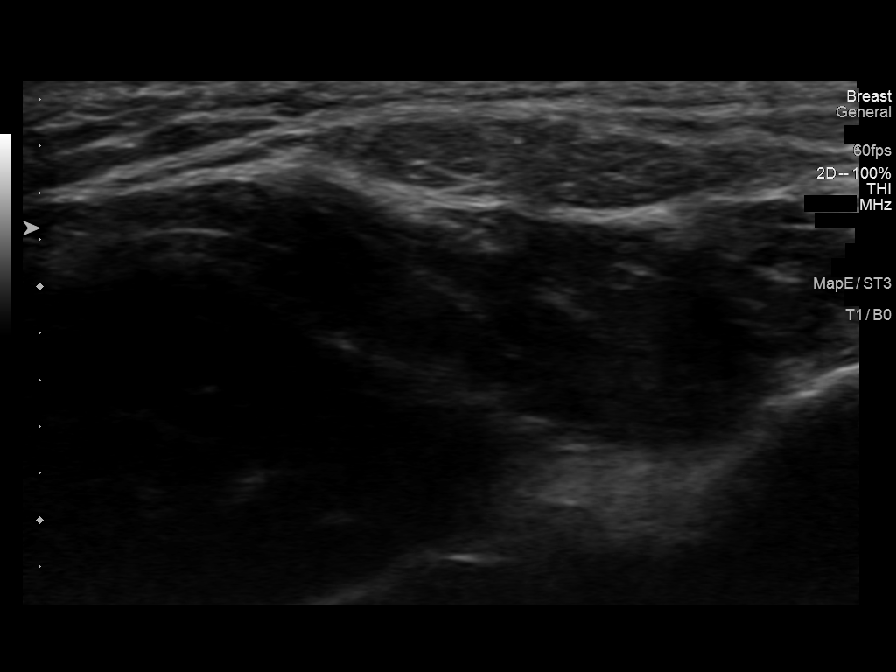
[im 8/8]
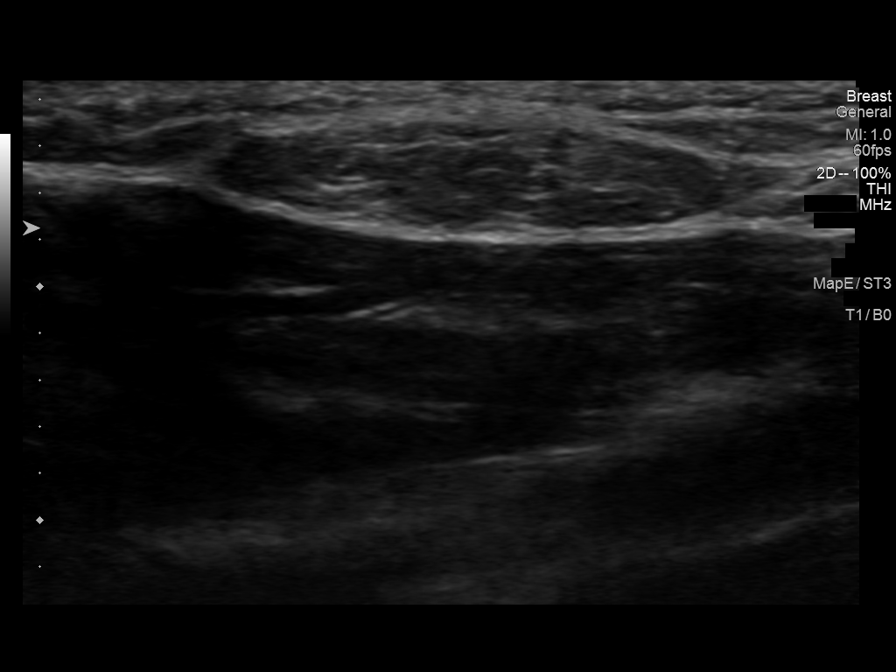

[8 of 8 positions shown; findings below may reference images not displayed]

FINDINGS: Within the left upper extremity over the biceps region there is a
heterogeneously echogenic subcutaneous soft tissue mass measuring
2.3 x 0.4 x 1.4 cm. The mass is well encapsulated. No internal
vascularity is seen. No other soft tissue abnormality is noted
within the area of interest.
IMPRESSION: Within the area of interest soft tissue mass which likely represent
a benign lipomatous mass.

## 2021-01-27 ENCOUNTER — Encounter (HOSPITAL_COMMUNITY): Payer: Self-pay

## 2021-01-27 ENCOUNTER — Other Ambulatory Visit: Payer: Self-pay

## 2021-01-27 ENCOUNTER — Ambulatory Visit (HOSPITAL_COMMUNITY)
Admission: EM | Admit: 2021-01-27 | Discharge: 2021-01-27 | Disposition: A | Discharge status: Home / Self Care | Payer: BC Managed Care – PPO | Attending: Emergency Medicine | Admitting: Emergency Medicine

## 2021-01-27 DIAGNOSIS — J4521 Mild intermittent asthma with (acute) exacerbation: Secondary | ICD-10-CM

## 2021-01-27 MED ORDER — PREDNISONE 10 MG PO TABS: ORAL_TABLET | ORAL | 0 refills | Status: AC

## 2021-01-27 MED ORDER — ALBUTEROL SULFATE HFA 108 (90 BASE) MCG/ACT IN AERS: 1.0000 | INHALATION_SPRAY | RESPIRATORY_TRACT | 0 refills | Status: DC | PRN

## 2021-01-27 NOTE — ED Provider Notes (Signed)
CHIEF COMPLAINT:   Chief Complaint  Patient presents with  . Asthma    SUBJECTIVE/HPI:  HPI A very pleasant 35 y.o.Male presents today with concern for asthma flare up. Patient reports cutting the grass a couple of days ago and states that he began to have coughing and wheezing. Patient reports that cough has improved with mucinex. He reports being out of his inhaler for about 1 week. No throat closing sensation, difficulty breathing, fevers, chills.   has a past medical history of Asthma.  ROS:  Review of Systems See Subjective/HPI Medications, Allergies and Problem List personally reviewed in Epic today OBJECTIVE:   Today's Vitals   01/27/21 0900 01/27/21 0901 01/27/21 0956  BP:  133/78   Pulse:  99   Resp:  19   Temp:  99.5 F (37.5 C)   SpO2:  100%   PainSc: 7   7    There is no height or weight on file to calculate BMI. Physical Exam   General: Appears well-developed and well-nourished. No acute distress.  HEENT Head: Normocephalic and atraumatic.  Ears: Hearing grossly intact, no drainage or visible deformity.  Nose: No nasal deviation. Mouth/Throat: No stridor or tracheal deviation.  Non erythematous posterior pharynx noted with clear drainage present.  No white patchy exudate noted. Eyes: Conjunctivae and EOM are normal. No eye drainage or scleral icterus bilaterally.  Neck: Normal range of motion, neck is supple. Cardiovascular: Normal rate. Regular rhythm; no murmurs, gallops, or rubs.  Pulm/Chest: No respiratory distress. Diffuse wheezing noted over bilateral lung fields with inhalation and expiration. Neurological: Alert and oriented to person, place, and time.  Skin: Skin is warm and dry.  No rashes, lesions, abrasions or bruising noted to skin.   Psychiatric: Normal mood, affect, behavior, and thought content.   Vital signs and nursing note reviewed.   Patient stable and cooperative with examination. PROCEDURES:    LABS/X-RAYS/EKG/MEDS:      MEDICAL DECISION MAKING:   Patient presents with concern for asthma flare up. Patient reports cutting the grass a couple of days ago and states that he began to have coughing and wheezing. Patient reports that cough has improved with mucinex. He reports being out of his inhaler for about 1 week. No throat closing sensation, difficulty breathing, fevers, chills. Chart review completed. Given symptoms, along with assessment findings, likely asthma exacerbation. RX'd prednisone and albuterol. Advised of at home treatment and care as directed. Return as needed. Stable on discharge.  Patient verbalized understanding and agreed with treatment plan.  Patient stable upon discharge. ASSESSMENT/PLAN:  1. Mild intermittent asthma with exacerbation   Instructions about new medications and side effects provided.  Plan:   Discharge Instructions     Follow-up with PCP within 1 week for recheck       A copy of these instructions have been given to the patient or responsible adult who demonstrated the ability to learn, asked appropriate questions, and verbalized understanding of the plan of care.  There were no barriers to learning identified.    Serafina Royals, FNP-C 01/27/21  This note was partially made with the aid of speech-to-text dictation; typographical errors are not intentional.   Serafina Royals, [place] 01/27/21 1001

## 2021-01-27 NOTE — Discharge Instructions (Signed)
Follow-up with PCP within 1 week for recheck

## 2021-01-27 NOTE — ED Triage Notes (Signed)
Pt presents with asthma flare up. Endorses wheezing, coughing, and pain with cough x 2 days. Reports using mucinex with some help.

## 2021-03-22 ENCOUNTER — Other Ambulatory Visit: Payer: Self-pay

## 2021-03-22 ENCOUNTER — Encounter: Payer: Self-pay | Admitting: Physician Assistant

## 2021-03-22 ENCOUNTER — Ambulatory Visit (INDEPENDENT_AMBULATORY_CARE_PROVIDER_SITE_OTHER): Payer: BC Managed Care – PPO | Admitting: Physician Assistant

## 2021-03-22 VITALS — BP 124/85 | HR 64 | Temp 98.5°F | Ht 68.5 in | Wt 141.6 lb

## 2021-03-22 DIAGNOSIS — Z0000 Encounter for general adult medical examination without abnormal findings: Secondary | ICD-10-CM | POA: Diagnosis not present

## 2021-03-22 DIAGNOSIS — J4531 Mild persistent asthma with (acute) exacerbation: Secondary | ICD-10-CM

## 2021-03-22 MED ORDER — FLUTICASONE PROPIONATE 50 MCG/ACT NA SUSP: 2.0000 | Freq: Every day | NASAL | 1 refills | Status: AC

## 2021-03-22 MED ORDER — IBUPROFEN 600 MG PO TABS: 600.0000 mg | ORAL_TABLET | Freq: Three times a day (TID) | ORAL | 0 refills | Status: AC | PRN

## 2021-03-22 MED ORDER — ALBUTEROL SULFATE HFA 108 (90 BASE) MCG/ACT IN AERS: 1.0000 | INHALATION_SPRAY | RESPIRATORY_TRACT | 0 refills | Status: DC | PRN

## 2021-03-22 NOTE — Progress Notes (Signed)
Male physical   Impression and Recommendations:    1. Healthcare maintenance   2. Mild persistent asthma with acute exacerbation      1) Anticipatory Guidance: Skin CA prevention- recommend to use sunscreen when outside along with skin surveillance; eat a balanced and modest diet; physical activity at least 25 minutes per day or minimum of 150 min/ week moderate to intense activity.  2) Immunizations / Screenings / Labs:   All immunizations are up-to-date per recommendations or will be updated today if pt allows.    - Patient understands with dental and vision screens they will schedule independently.  - Will obtain CBC, CMP, HgA1c, Lipid panel, TSH and vit D when fasting, if not already done past 12 mo/ recently - UTD Tdap   3) Healthcare Maintenance: -Schedule lab visit. -Recommend to stay hydrated. -Follow a Mediterranean diet.  -Follow up in 6 months for asthma   No orders of the defined types were placed in this encounter.   Meds ordered this encounter  Medications   albuterol (VENTOLIN HFA) 108 (90 Base) MCG/ACT inhaler    Sig: Inhale 1-2 puffs into the lungs every 4 (four) hours as needed for wheezing or shortness of breath.    Dispense:  6.7 g    Refill:  0    Order Specific Question:   Supervising Provider    Answer:   Beatrice Lecher D [[id number]]   fluticasone (FLONASE) 50 MCG/ACT nasal spray    Sig: Place 2 sprays into both nostrils daily.    Dispense:  49 g    Refill:  1    Order Specific Question:   Supervising Provider    Answer:   Beatrice Lecher D [[id number]]   ibuprofen (ADVIL) 600 MG tablet    Sig: Take 1 tablet (600 mg total) by mouth every 8 (eight) hours as needed for moderate pain.    Dispense:  30 tablet    Refill:  0    Order Specific Question:   Supervising Provider    Answer:   Beatrice Lecher D [[id number]]     Return in about 6 months (around 09/22/2021) for asthma; lab visit FBW in 1-3 weeks.    Gross side effects, risk and  benefits, and alternatives of medications discussed with patient.  Patient is aware that all medications have potential side effects and we are unable to predict every side effect or drug-drug interaction that may occur.  Expresses verbal understanding and consents to current therapy plan and treatment regimen.  Please see AVS handed out to patient at the end of our visit for further patient instructions/ counseling done pertaining to today's office visit.     Subjective:        CC: CPE   HPI: Henry Brady is a 35 y.o. male who presents to [place] [facility] today for a yearly health maintenance exam.     Health Maintenance Summary  - Reviewed and updated, unless pt declines services.  Family history of Colon CA: Unknown Tobacco History Reviewed:   yes, changed from cigars to vaping.  Alcohol / drug use:    No concerns, no excessive use / no use Exercise Habits: no routine exercise regimen   Dental Home:  Not routinely  Male history: STD concerns:   none  Additional concerns beyond Health Maintenance issues:   none    Immunization History  Administered Date(s) Administered   PFIZER(Purple Top)SARS-COV-2 Vaccination 02/29/2020, 03/21/2020   Tdap 12/19/2017  Health Maintenance  Topic Date Due   Pneumococcal Vaccine 71-34 Years old (1 - PCV) Never done   HIV Screening  Never done   Hepatitis C Screening  Never done   COVID-19 Vaccine (3 - Booster for Pfizer series) 08/21/2020   INFLUENZA VACCINE  03/22/2021   TETANUS/TDAP  12/20/2027   HPV VACCINES  Aged Out       Wt Readings from Last 3 Encounters:  03/22/21 141 lb 9.6 oz (64.2 kg)  03/18/19 145 lb 6.4 oz (66 kg)  02/07/19 150 lb 3.2 oz (68.1 kg)   BP Readings from Last 3 Encounters:  03/22/21 124/85  01/27/21 133/78  03/10/20 (!) 154/96   Pulse Readings from Last 3 Encounters:  03/22/21 64  01/27/21 99  03/10/20 69    Patient Active Problem List   Diagnosis Date Noted    Tick bite of back 02/07/2019   Dental abscess 12/04/2018   Lipoma of left upper extremity 10/01/2018   Asthma 12/19/2017   Acute bronchitis 12/19/2017   Healthcare maintenance 12/19/2017    Past Medical History:  Diagnosis Date   Asthma     History reviewed. No pertinent surgical history.  Family History  Adopted: Yes    Social History   Substance and Sexual Activity  Drug Use No  ,  Social History   Substance and Sexual Activity  Alcohol Use Yes   Comment: occ  ,  Social History   Tobacco Use  Smoking Status Some Days   Types: Cigars  Smokeless Tobacco Never  Tobacco Comments   smokes 2 cigars per day  ,  Social History   Substance and Sexual Activity  Sexual Activity Yes   Birth control/protection: Spermicide    Patient's Medications  New Prescriptions   No medications on file  Previous Medications   No medications on file  Modified Medications   Modified Medication Previous Medication   ALBUTEROL (VENTOLIN HFA) 108 (90 BASE) MCG/ACT INHALER albuterol (VENTOLIN HFA) 108 (90 Base) MCG/ACT inhaler      Inhale 1-2 puffs into the lungs every 4 (four) hours as needed for wheezing or shortness of breath.    Inhale 1-2 puffs into the lungs every 4 (four) hours as needed for wheezing or shortness of breath.   FLUTICASONE (FLONASE) 50 MCG/ACT NASAL SPRAY fluticasone (FLONASE) 50 MCG/ACT nasal spray      Place 2 sprays into both nostrils daily.    Place 2 sprays into both nostrils daily.   IBUPROFEN (ADVIL) 600 MG TABLET ibuprofen (ADVIL,MOTRIN) 600 MG tablet      Take 1 tablet (600 mg total) by mouth every 8 (eight) hours as needed for moderate pain.    Take 1 tablet (600 mg total) by mouth every 6 (six) hours as needed.  Discontinued Medications   ALBUTEROL (VENTOLIN HFA) 108 (90 BASE) MCG/ACT INHALER    Inhale 2 puffs into the lungs every 6 (six) hours as needed for wheezing or shortness of breath.    Patient has no known allergies.  Review of  Systems: General:   Denies fever, chills, unexplained weight loss.  Optho/Auditory:   Denies visual changes, blurred vision/LOV Respiratory:   Denies SOB, DOE more than baseline levels.   Cardiovascular:   Denies chest pain, palpitations, new onset peripheral edema  Gastrointestinal:   Denies nausea, vomiting, diarrhea.  Genitourinary: Denies dysuria, urgency, flank pain,+ frequency   Endocrine:     Denies hot or cold intolerance, polyuria, polydipsia. Musculoskeletal:   Denies unexplained myalgias, joint  swelling, unexplained arthralgias, gait problems.  Skin:  Denies rash, suspicious lesions Neurological:     Denies dizziness, unexplained weakness, numbness  Psychiatric/Behavioral:   Denies mood changes, suicidal or homicidal ideations, hallucinations    Objective:     Blood pressure 124/85, pulse 64, temperature 98.5 F (36.9 C), height 5' 8.5" (1.74 m), weight 141 lb 9.6 oz (64.2 kg), SpO2 100 %. Body mass index is 21.22 kg/m. General Appearance:    Alert, cooperative, no distress, appears stated age  Head:    Normocephalic, without obvious abnormality, atraumatic  Eyes:    PERRL, conjunctiva/corneas clear, EOM's intact, fundi    benign, both eyes  Ears:    Normal TM's and external ear canals, both ears  Nose:   Nares normal, septum midline, mucosa normal, no drainage    or sinus tenderness  Throat:   Lips w/o lesion, mucosa moist, and tongue normal; teeth and gums fair  Neck:   Supple, symmetrical, trachea midline, no adenopathy;    thyroid:  no enlargement/tenderness/nodules;  Back:     Symmetric, no curvature, ROM normal, no CVA tenderness  Lungs:     Clear to auscultation bilaterally, respirations unlabored, no  Wh/ R/ R  Chest Wall:    No tenderness or gross deformity; normal excursion   Heart:    Regular rate and rhythm, S1 and S2 normal, no murmur, rub   or gallop  Abdomen:     Soft, non-tender, bowel sounds active all four quadrants, No G/R/R, no masses, no  organomegaly  Genitalia:   Deferred.  Rectal:   Deferred.  Extremities:   Extremities normal, atraumatic, no cyanosis or gross edema  Pulses:   2+ and symmetric all extremities  Skin:   Warm, dry, Skin color, texture, turgor normal, no obvious rashes or lesions  M-Sk:   Ambulates * 4 w/o difficulty, no gross deformities, tone WNL  Neurologic:   CNII-XII intact, normal strength, sensation and reflexes    Throughout Psych:  No HI/SI, judgement and insight good, Euthymic mood. Full Affect.

## 2021-03-22 NOTE — Patient Instructions (Signed)
Preventive Care 42-35 Years Old, Male Preventive care refers to lifestyle choices and visits with your health care provider that can promote health and wellness. This includes: A yearly physical exam. This is also called an annual wellness visit. Regular dental and eye exams. Immunizations. Screening for certain conditions. Healthy lifestyle choices, such as: Eating a healthy diet. Getting regular exercise. Not using drugs or products that contain nicotine and tobacco. Limiting alcohol use. What can I expect for my preventive care visit? Physical exam Your health care provider may check your: Height and weight. These may be used to calculate your BMI (body mass index). BMI is a measurement that tells if you are at a healthy weight. Heart rate and blood pressure. Body temperature. Skin for abnormal spots. Counseling Your health care provider may ask you questions about your: Past medical problems. Family's medical history. Alcohol, tobacco, and drug use. Emotional well-being. Home life and relationship well-being. Sexual activity. Diet, exercise, and sleep habits. Work and work Statistician. Access to firearms. What immunizations do I need?  Vaccines are usually given at various ages, according to a schedule. Your health care provider will recommend vaccines for you based on your age, medicalhistory, and lifestyle or other factors, such as travel or where you work. What tests do I need? Blood tests Lipid and cholesterol levels. These may be checked every 5 years starting at age 72. Hepatitis C test. Hepatitis B test. Screening  Diabetes screening. This is done by checking your blood sugar (glucose) after you have not eaten for a while (fasting). Genital exam to check for testicular cancer or hernias. STD (sexually transmitted disease) testing, if you are at risk. Talk with your health care provider about your test results, treatment options,and if necessary, the need for more  tests. Follow these instructions at home: Eating and drinking  Eat a healthy diet that includes fresh fruits and vegetables, whole grains, lean protein, and low-fat dairy products. Drink enough fluid to keep your urine pale yellow. Take vitamin and mineral supplements as recommended by your health care provider. Do not drink alcohol if your health care provider tells you not to drink. If you drink alcohol: Limit how much you have to 0-2 drinks a day. Be aware of how much alcohol is in your drink. In the [place] one drink equals one 12 oz bottle of beer (355 mL), one 5 oz glass of wine (148 mL), or one 1 oz glass of hard liquor (44 mL).  Lifestyle Take daily care of your teeth and gums. Brush your teeth every morning and night with fluoride toothpaste. Floss one time each day. Stay active. Exercise for at least 30 minutes 5 or more days each week. Do not use any products that contain nicotine or tobacco, such as cigarettes, e-cigarettes, and chewing tobacco. If you need help quitting, ask your health care provider. Do not use drugs. If you are sexually active, practice safe sex. Use a condom or other form of protection to prevent STIs (sexually transmitted infections). Find healthy ways to cope with stress, such as: Meditation, yoga, or listening to music. Journaling. Talking to a trusted person. Spending time with friends and family. Safety Always wear your seat belt while driving or riding in a vehicle. Do not drive: If you have been drinking alcohol. Do not ride with someone who has been drinking. When you are tired or distracted. While texting. Wear a helmet and other protective equipment during sports activities. If you have firearms in your house, make sure  you follow all gun safety procedures. Seek help if you have been physically or sexually abused. What's next? Go to your health care provider once a year for an annual wellness visit. Ask your health care provider how often  you should have your eyes and teeth checked. Stay up to date on all vaccines. This information is not intended to replace advice given to you by your health care provider. Make sure you discuss any questions you have with your healthcare provider. Document Revised: 04/24/2019 Document Reviewed: 08/02/2018 Elsevier Patient Education  2022 [employer].

## 2021-03-26 ENCOUNTER — Other Ambulatory Visit: Payer: Self-pay

## 2021-03-26 DIAGNOSIS — Z0000 Encounter for general adult medical examination without abnormal findings: Secondary | ICD-10-CM

## 2021-03-26 NOTE — Progress Notes (Unsigned)
cmp

## 2021-03-29 ENCOUNTER — Other Ambulatory Visit: Payer: Self-pay

## 2021-03-29 ENCOUNTER — Other Ambulatory Visit: Payer: BC Managed Care – PPO

## 2021-03-29 DIAGNOSIS — Z0000 Encounter for general adult medical examination without abnormal findings: Secondary | ICD-10-CM

## 2021-03-30 LAB — COMPREHENSIVE METABOLIC PANEL
ALT: 6 IU/L (ref 0–44)
AST: 25 IU/L (ref 0–40)
Albumin/Globulin Ratio: 1.7 (ref 1.2–2.2)
Albumin: 4.2 g/dL (ref 4.0–5.0)
Alkaline Phosphatase: 62 IU/L (ref 44–121)
BUN/Creatinine Ratio: 12 (ref 9–20)
BUN: 13 mg/dL (ref 6–20)
Bilirubin Total: 0.3 mg/dL (ref 0.0–1.2)
CO2: 20 mmol/L (ref 20–29)
Calcium: 8.8 mg/dL (ref 8.7–10.2)
Chloride: 106 mmol/L (ref 96–106)
Creatinine, Ser: 1.07 mg/dL (ref 0.76–1.27)
Globulin, Total: 2.5 g/dL (ref 1.5–4.5)
Glucose: 96 mg/dL (ref 65–99)
Potassium: 4.6 mmol/L (ref 3.5–5.2)
Sodium: 145 mmol/L — ABNORMAL HIGH (ref 134–144)
Total Protein: 6.7 g/dL (ref 6.0–8.5)
eGFR: 93 mL/min/{1.73_m2} (ref >59)

## 2021-03-30 LAB — CBC
Hematocrit: 43.5 % (ref 37.5–51.0)
Hemoglobin: 15 g/dL (ref 13.0–17.7)
MCH: 29.8 pg (ref 26.6–33.0)
MCHC: 34.5 g/dL (ref 31.5–35.7)
MCV: 87 fL (ref 79–97)
Platelets: 184 10*3/uL (ref 150–450)
RBC: 5.03 x10E6/uL (ref 4.14–5.80)
RDW: 12.9 % (ref 11.6–15.4)
WBC: 4 10*3/uL (ref 3.4–10.8)

## 2021-03-30 LAB — LIPID PANEL
Chol/HDL Ratio: 2.3 ratio (ref 0.0–5.0)
Cholesterol, Total: 145 mg/dL (ref 100–199)
HDL: 63 mg/dL (ref >39)
LDL Chol Calc (NIH): 67 mg/dL (ref 0–99)
Triglycerides: 74 mg/dL (ref 0–149)
VLDL Cholesterol Cal: 15 mg/dL (ref 5–40)

## 2021-03-30 LAB — TSH: TSH: 0.292 u[IU]/mL — ABNORMAL LOW (ref 0.450–4.500)

## 2021-03-30 LAB — HEMOGLOBIN A1C
Est. average glucose Bld gHb Est-mCnc: 103 mg/dL
Hgb A1c MFr Bld: 5.2 % (ref 4.8–5.6)

## 2021-05-11 ENCOUNTER — Other Ambulatory Visit: Payer: Self-pay

## 2021-05-11 DIAGNOSIS — R7989 Other specified abnormal findings of blood chemistry: Secondary | ICD-10-CM

## 2021-05-12 ENCOUNTER — Other Ambulatory Visit: Payer: BC Managed Care – PPO

## 2021-05-12 ENCOUNTER — Other Ambulatory Visit: Payer: Self-pay

## 2021-05-12 DIAGNOSIS — R7989 Other specified abnormal findings of blood chemistry: Secondary | ICD-10-CM

## 2021-05-13 LAB — TSH: TSH: 0.508 u[IU]/mL (ref 0.450–4.500)

## 2021-05-13 LAB — T4, FREE: Free T4: 1.34 ng/dL (ref 0.82–1.77)

## 2021-05-13 LAB — T3: T3, Total: 92 ng/dL (ref 71–180)

## 2021-09-22 NOTE — Progress Notes (Signed)
Established Patient Office Visit  Subjective:  Patient ID: Henry Brady, male    DOB: [birth year]  Age: 36 y.o. MRN: [id number]  CC:  Chief Complaint  Patient presents with   Follow-up   Asthma    HPI Henry Brady presents for follow up on asthma. Patient is accompanied by his wife. Patient reports is having trouble with sleep which has been going on for a little over a year. Wakes up at night. Does report has two jobs and feels tired which should help with sleep but does not. Patient is able to stay asleep 4 hours per night. Also does not feel rested. Patient's wife states he does snore at night. Drinks about 2 cups of coffee daily in the mornings. States last week tried melatonin 5 mg. Pt denies asthma exacerbations. States has not needed to use albuterol inhaler, states last time he used it was last summer. No shortness of breath or wheezing.  Past Medical History:  Diagnosis Date   Asthma     History reviewed. No pertinent surgical history.  Family History  Adopted: Yes    Social History   Socioeconomic History   Marital status: Married    Spouse name: Not on file   Number of children: Not on file   Years of education: Not on file   Highest education level: Not on file  Occupational History   Not on file  Tobacco Use   Smoking status: Some Days    Types: Cigars   Smokeless tobacco: Never   Tobacco comments:    smokes 2 cigars per day  Vaping Use   Vaping Use: Never used  Substance and Sexual Activity   Alcohol use: Yes    Comment: occ   Drug use: No   Sexual activity: Yes    Birth control/protection: Spermicide  Other Topics Concern   Not on file  Social History Narrative   Not on file   Social Determinants of Health   Financial Resource Strain: Not on file  Food Insecurity: Not on file  Transportation Needs: Not on file  Physical Activity: Not on file  Stress: Not on file  Social Connections: Not on file  Intimate Partner Violence: Not on file     Outpatient Medications Prior to Visit  Medication Sig Dispense Refill   fluticasone (FLONASE) 50 MCG/ACT nasal spray Place 2 sprays into both nostrils daily. 49 g 1   ibuprofen (ADVIL) 600 MG tablet Take 1 tablet (600 mg total) by mouth every 8 (eight) hours as needed for moderate pain. 30 tablet 0   albuterol (VENTOLIN HFA) 108 (90 Base) MCG/ACT inhaler Inhale 1-2 puffs into the lungs every 4 (four) hours as needed for wheezing or shortness of breath. 6.7 g 0   No facility-administered medications prior to visit.    No Known Allergies  ROS Review of Systems Review of Systems:  A fourteen system review of systems was performed and found to be positive as per HPI.   Objective:    Physical Exam General:  Well Developed, well nourished, in no acute distress Neuro:  Alert and oriented,  extra-ocular muscles intact  HEENT:  Normocephalic, atraumatic, neck supple  Skin:  no gross rash, warm, pink. Cardiac:  RRR, S1 S2 Respiratory: CTA B/L w/o wheezing, crackles or rales. Vascular:  Ext warm, no cyanosis apprec.; cap RF less 2 sec. Psych:  No HI/SI, judgement and insight good, Euthymic mood. Full Affect.  BP 137/83    Pulse 72  Temp (!) 97.3 F (36.3 C)    Ht 5' 6"  (1.676 m)    Wt 145 lb (65.8 kg)    SpO2 100%    BMI 23.40 kg/m  Wt Readings from Last 3 Encounters:  09/27/21 145 lb (65.8 kg)  03/22/21 141 lb 9.6 oz (64.2 kg)  03/18/19 145 lb 6.4 oz (66 kg)     Health Maintenance Due  Topic Date Due   HIV Screening  Never done   Hepatitis C Screening  Never done   COVID-19 Vaccine (3 - Booster for Pfizer series) 05/16/2020   INFLUENZA VACCINE  Never done    There are no preventive care reminders to display for this patient.  Lab Results  Component Value Date   TSH 0.508 05/12/2021   Lab Results  Component Value Date   WBC 4.0 03/29/2021   HGB 15.0 03/29/2021   HCT 43.5 03/29/2021   MCV 87 03/29/2021   PLT 184 03/29/2021   Lab Results  Component Value Date    NA 145 (H) 03/29/2021   K 4.6 03/29/2021   CO2 20 03/29/2021   GLUCOSE 96 03/29/2021   BUN 13 03/29/2021   CREATININE 1.07 03/29/2021   BILITOT 0.3 03/29/2021   ALKPHOS 62 03/29/2021   AST 25 03/29/2021   ALT 6 03/29/2021   PROT 6.7 03/29/2021   ALBUMIN 4.2 03/29/2021   CALCIUM 8.8 03/29/2021   EGFR 93 03/29/2021   Lab Results  Component Value Date   CHOL 145 03/29/2021   Lab Results  Component Value Date   HDL 63 03/29/2021   Lab Results  Component Value Date   LDLCALC 67 03/29/2021   Lab Results  Component Value Date   TRIG 74 03/29/2021   Lab Results  Component Value Date   CHOLHDL 2.3 03/29/2021   Lab Results  Component Value Date   HGBA1C 5.2 03/29/2021      Assessment & Plan:   Problem List Items Addressed This Visit       Respiratory   Asthma - Primary (Chronic)   Relevant Medications   albuterol (VENTOLIN HFA) 108 (90 Base) MCG/ACT inhaler   Other Visit Diagnoses     Insomnia, unspecified type       Relevant Medications   traZODone (DESYREL) 50 MG tablet   Other Relevant Orders   Ambulatory referral to Sleep Studies      Asthma: -Controlled. -Continue current medication regimen. -Will continue to monitor.  Insomnia, unspecified type: -Discussed with patient sleep hygiene including reducing caffeine intake and establishing a good sleep routine. Will trial trazodone 25-50 mg to take as bedtime as needed. Discussed potential side effects and advised to let me know if unable to tolerate medication or dose ineffective. Will reassess sleep and medication therapy at follow up visit. -Patient completed stop bang questionnaire with a score of 3 and epworth sleepiness scale of 14. Will place order for sleep study.    Meds ordered this encounter  Medications   albuterol (VENTOLIN HFA) 108 (90 Base) MCG/ACT inhaler    Sig: Inhale 1-2 puffs into the lungs every 4 (four) hours as needed for wheezing or shortness of breath.    Dispense:  6.7 g     Refill:  0   traZODone (DESYREL) 50 MG tablet    Sig: Take 0.5-1 tablets (25-50 mg total) by mouth at bedtime as needed for sleep.    Dispense:  30 tablet    Refill:  1    Order Specific Question:  Supervising Provider    Answer:   Beatrice Lecher D [[id number]]    Follow-up: Return in about 6 months (around 03/27/2022) for CPE and FBW few days prior .    Lorrene Reid, PA-C

## 2021-09-27 ENCOUNTER — Encounter: Payer: Self-pay | Admitting: Physician Assistant

## 2021-09-27 ENCOUNTER — Ambulatory Visit (INDEPENDENT_AMBULATORY_CARE_PROVIDER_SITE_OTHER): Payer: BC Managed Care – PPO | Admitting: Physician Assistant

## 2021-09-27 ENCOUNTER — Other Ambulatory Visit: Payer: Self-pay

## 2021-09-27 VITALS — BP 137/83 | HR 72 | Temp 97.3°F | Ht 66.0 in | Wt 145.0 lb

## 2021-09-27 DIAGNOSIS — J4530 Mild persistent asthma, uncomplicated: Secondary | ICD-10-CM

## 2021-09-27 DIAGNOSIS — G4700 Insomnia, unspecified: Secondary | ICD-10-CM

## 2021-09-27 MED ORDER — ALBUTEROL SULFATE HFA 108 (90 BASE) MCG/ACT IN AERS: 1.0000 | INHALATION_SPRAY | RESPIRATORY_TRACT | 0 refills | Status: DC | PRN

## 2021-09-27 MED ORDER — TRAZODONE HCL 50 MG PO TABS: 25.0000 mg | ORAL_TABLET | Freq: Every evening | ORAL | 1 refills | Status: DC | PRN

## 2021-09-27 NOTE — Patient Instructions (Signed)

## 2021-10-05 ENCOUNTER — Other Ambulatory Visit: Payer: Self-pay

## 2021-10-05 DIAGNOSIS — J4530 Mild persistent asthma, uncomplicated: Secondary | ICD-10-CM

## 2021-10-05 MED ORDER — ALBUTEROL SULFATE HFA 108 (90 BASE) MCG/ACT IN AERS: 2.0000 | INHALATION_SPRAY | Freq: Four times a day (QID) | RESPIRATORY_TRACT | 2 refills | Status: DC | PRN

## 2022-03-28 ENCOUNTER — Encounter: Payer: Self-pay | Admitting: Physician Assistant

## 2022-03-28 ENCOUNTER — Other Ambulatory Visit: Payer: BC Managed Care – PPO

## 2022-03-30 ENCOUNTER — Encounter: Payer: Self-pay | Admitting: Physician Assistant

## 2022-03-30 ENCOUNTER — Ambulatory Visit (INDEPENDENT_AMBULATORY_CARE_PROVIDER_SITE_OTHER): Payer: BC Managed Care – PPO | Admitting: Physician Assistant

## 2022-03-30 VITALS — BP 119/70 | HR 80 | Ht 66.14 in | Wt 141.4 lb

## 2022-03-30 DIAGNOSIS — Z0000 Encounter for general adult medical examination without abnormal findings: Secondary | ICD-10-CM

## 2022-03-30 DIAGNOSIS — R7989 Other specified abnormal findings of blood chemistry: Secondary | ICD-10-CM

## 2022-03-30 DIAGNOSIS — J4530 Mild persistent asthma, uncomplicated: Secondary | ICD-10-CM

## 2022-03-30 MED ORDER — ALBUTEROL SULFATE HFA 108 (90 BASE) MCG/ACT IN AERS: 2.0000 | INHALATION_SPRAY | Freq: Four times a day (QID) | RESPIRATORY_TRACT | 2 refills | Status: DC | PRN

## 2022-03-30 NOTE — Progress Notes (Signed)
Complete physical exam   Patient: Henry Brady   DOB: 25-May-1986   36 y.o. Male  MRN: [id number] Visit Date: 03/30/2022   Chief Complaint  Patient presents with   Annual Exam   Subjective    Henry Brady is a 36 y.o. male who presents today for a complete physical exam.  He reports consuming a general diet. The patient has a physically strenuous job, but has no regular exercise apart from work.  He generally feels fairly well. He does not have additional problems to discuss today.    Past Medical History:  Diagnosis Date   Asthma    History reviewed. No pertinent surgical history. Social History   Socioeconomic History   Marital status: Married    Spouse name: Not on file   Number of children: Not on file   Years of education: Not on file   Highest education level: Not on file  Occupational History   Not on file  Tobacco Use   Smoking status: Some Days    Types: Cigars   Smokeless tobacco: Never   Tobacco comments:    smokes 2 cigars per day  Vaping Use   Vaping Use: Never used  Substance and Sexual Activity   Alcohol use: Yes    Comment: occ   Drug use: No   Sexual activity: Yes    Birth control/protection: Spermicide  Other Topics Concern   Not on file  Social History Narrative   Not on file   Social Determinants of Health   Financial Resource Strain: Not on file  Food Insecurity: Not on file  Transportation Needs: Not on file  Physical Activity: Not on file  Stress: Not on file  Social Connections: Not on file  Intimate Partner Violence: Not on file     Medications: Outpatient Medications Prior to Visit  Medication Sig   fluticasone (FLONASE) 50 MCG/ACT nasal spray Place 2 sprays into both nostrils daily.   ibuprofen (ADVIL) 600 MG tablet Take 1 tablet (600 mg total) by mouth every 8 (eight) hours as needed for moderate pain.   [DISCONTINUED] albuterol (VENTOLIN HFA) 108 (90 Base) MCG/ACT inhaler Inhale 2 puffs into the lungs every 6 (six)  hours as needed for wheezing or shortness of breath.   [DISCONTINUED] traZODone (DESYREL) 50 MG tablet Take 0.5-1 tablets (25-50 mg total) by mouth at bedtime as needed for sleep.   No facility-administered medications prior to visit.    Review of Systems Review of Systems:  A fourteen system review of systems was performed and found to be positive as per HPI.  Last CBC Lab Results  Component Value Date   WBC 4.0 03/29/2021   HGB 15.0 03/29/2021   HCT 43.5 03/29/2021   MCV 87 03/29/2021   MCH 29.8 03/29/2021   RDW 12.9 03/29/2021   PLT 184 27/25/3664   Last metabolic panel Lab Results  Component Value Date   GLUCOSE 96 03/29/2021   NA 145 (H) 03/29/2021   K 4.6 03/29/2021   CL 106 03/29/2021   CO2 20 03/29/2021   BUN 13 03/29/2021   CREATININE 1.07 03/29/2021   EGFR 93 03/29/2021   CALCIUM 8.8 03/29/2021   PROT 6.7 03/29/2021   ALBUMIN 4.2 03/29/2021   LABGLOB 2.5 03/29/2021   AGRATIO 1.7 03/29/2021   BILITOT 0.3 03/29/2021   ALKPHOS 62 03/29/2021   AST 25 03/29/2021   ALT 6 03/29/2021   Last lipids Lab Results  Component Value Date   CHOL 145 03/29/2021  HDL 63 03/29/2021   LDLCALC 67 03/29/2021   TRIG 74 03/29/2021   CHOLHDL 2.3 03/29/2021   Last hemoglobin A1c Lab Results  Component Value Date   HGBA1C 5.2 03/29/2021   Last thyroid functions Lab Results  Component Value Date   TSH 0.508 05/12/2021   T3TOTAL 92 05/12/2021   Last vitamin D No results found for: "25OHVITD2", "25OHVITD3", "VD25OH"    Objective     BP 119/70   Pulse 80   Ht 5' 6.14" (1.68 m)   Wt 141 lb 6.4 oz (64.1 kg)   SpO2 97%   BMI 22.72 kg/m     Physical Exam   General Appearance:    Well developed, well nourished male. Alert, cooperative, in no acute distress, appears stated age  Head:    Normocephalic, without obvious abnormality, atraumatic  Eyes:    PERRL, conjunctiva/corneas clear, EOM's intact, fundi    benign, both eyes       Ears:    Normal TM's and  external ear canals, both ears  Nose:   Nares normal, septum midline, mucosa normal, no drainage   or sinus tenderness  Throat:   Lips, mucosa, and tongue normal; teeth and gums normal  Neck:   Supple, symmetrical, trachea midline, no adenopathy;       thyroid:  No enlargement/tenderness/nodules; no JVD  Back:     Symmetric, no curvature, ROM normal, no CVA tenderness  Lungs:     Clear to auscultation bilaterally, respirations unlabored  Chest wall:    No tenderness or deformity  Heart:    Normal heart rate. Normal rhythm. No murmurs, rubs, or gallops.  S1 and S2 normal  Abdomen:     Soft, non-tender, bowel sounds active all four quadrants,    no masses, no organomegaly  Genitalia:    deferred  Rectal:    deferred  Extremities:   All extremities are intact. No cyanosis or edema  Pulses:   2+ and symmetric all extremities  Skin:   Skin color, texture, turgor normal, no rashes or lesions  Lymph nodes:   Cervical and supraclavicular nodes normal  Neurologic:   CNII-XII grossly intact.      Last depression screening scores    03/30/2022    4:02 PM 09/27/2021    4:30 PM 03/22/2021    4:17 PM  PHQ 2/9 Scores  PHQ - 2 Score 0 0 0  PHQ- 9 Score 2 4 0   Last fall risk screening    09/27/2021    4:30 PM  Bartholomew in the past year? 0  Number falls in past yr: 0  Injury with Fall? 0  Risk for fall due to : No Fall Risks  Follow up Falls evaluation completed     No results found for any visits on 03/30/22.  Assessment & Plan    Routine Health Maintenance and Physical Exam  Exercise Activities and Dietary recommendations -Discussed heart healthy diet low in fat and carbohydrates. Recommend moderate exercise 150 mins/wk.  Immunization History  Administered Date(s) Administered   PFIZER(Purple Top)SARS-COV-2 Vaccination 02/29/2020, 03/21/2020   Tdap 12/19/2017    Health Maintenance  Topic Date Due   HIV Screening  Never done   Hepatitis C Screening  Never done    COVID-19 Vaccine (3 - Pfizer series) 05/16/2020   INFLUENZA VACCINE  03/22/2022   TETANUS/TDAP  12/20/2027   HPV VACCINES  Aged Out    Discussed health benefits of physical activity, and encouraged  him to engage in regular exercise appropriate for his age and condition.  Problem List Items Addressed This Visit       Respiratory   Asthma (Chronic)   Relevant Medications   albuterol (VENTOLIN HFA) 108 (90 Base) MCG/ACT inhaler     Other   Healthcare maintenance - Primary   Other Visit Diagnoses     Abnormal serum thyroid stimulating hormone (TSH) level          Advised to schedule lab visit for routine fasting blood-work. Pt deferred Hep C and HIV screenings. Provided refill for albuterol, RF expired. Asthma has been controlled. Discussed adequate hydration. Follow-up in 1 year for CPE and FBW or sooner if needed.  Return in about 1 year (around 03/31/2023) for CPE and FBW; lab visit for FBW in 1-4 weeks.       Lorrene Reid, PA-C  [facility] at [facility] [phone] (phone) [phone] (fax)  [place]

## 2022-03-30 NOTE — Patient Instructions (Signed)
Preventive Care 21-36 Years Old, Male Preventive care refers to lifestyle choices and visits with your health care provider that can promote health and wellness. Preventive care visits are also called wellness exams. What can I expect for my preventive care visit? Counseling During your preventive care visit, your health care provider may ask about your: Medical history, including: Past medical problems. Family medical history. Current health, including: Emotional well-being. Home life and relationship well-being. Sexual activity. Lifestyle, including: Alcohol, nicotine or tobacco, and drug use. Access to firearms. Diet, exercise, and sleep habits. Safety issues such as seatbelt and bike helmet use. Sunscreen use. Work and work environment. Physical exam Your health care provider may check your: Height and weight. These may be used to calculate your BMI (body mass index). BMI is a measurement that tells if you are at a healthy weight. Waist circumference. This measures the distance around your waistline. This measurement also tells if you are at a healthy weight and may help predict your risk of certain diseases, such as type 2 diabetes and high blood pressure. Heart rate and blood pressure. Body temperature. Skin for abnormal spots. What immunizations do I need?  Vaccines are usually given at various ages, according to a schedule. Your health care provider will recommend vaccines for you based on your age, medical history, and lifestyle or other factors, such as travel or where you work. What tests do I need? Screening Your health care provider may recommend screening tests for certain conditions. This may include: Lipid and cholesterol levels. Diabetes screening. This is done by checking your blood sugar (glucose) after you have not eaten for a while (fasting). Hepatitis B test. Hepatitis C test. HIV (human immunodeficiency virus) test. STI (sexually transmitted infection)  testing, if you are at risk. Talk with your health care provider about your test results, treatment options, and if necessary, the need for more tests. Follow these instructions at home: Eating and drinking  Eat a healthy diet that includes fresh fruits and vegetables, whole grains, lean protein, and low-fat dairy products. Drink enough fluid to keep your urine pale yellow. Take vitamin and mineral supplements as recommended by your health care provider. Do not drink alcohol if your health care provider tells you not to drink. If you drink alcohol: Limit how much you have to 0-2 drinks a day. Know how much alcohol is in your drink. In the [place] one drink equals one 12 oz bottle of beer (355 mL), one 5 oz glass of wine (148 mL), or one 1 oz glass of hard liquor (44 mL). Lifestyle Brush your teeth every morning and night with fluoride toothpaste. Floss one time each day. Exercise for at least 30 minutes 5 or more days each week. Do not use any products that contain nicotine or tobacco. These products include cigarettes, chewing tobacco, and vaping devices, such as e-cigarettes. If you need help quitting, ask your health care provider. Do not use drugs. If you are sexually active, practice safe sex. Use a condom or other form of protection to prevent STIs. Find healthy ways to manage stress, such as: Meditation, yoga, or listening to music. Journaling. Talking to a trusted person. Spending time with friends and family. Minimize exposure to UV radiation to reduce your risk of skin cancer. Safety Always wear your seat belt while driving or riding in a vehicle. Do not drive: If you have been drinking alcohol. Do not ride with someone who has been drinking. If you have been using any mind-altering substances   or drugs. While texting. When you are tired or distracted. Wear a helmet and other protective equipment during sports activities. If you have firearms in your house, make sure you  follow all gun safety procedures. Seek help if you have been physically or sexually abused. What's next? Go to your health care provider once a year for an annual wellness visit. Ask your health care provider how often you should have your eyes and teeth checked. Stay up to date on all vaccines. This information is not intended to replace advice given to you by your health care provider. Make sure you discuss any questions you have with your health care provider. Document Revised: 02/03/2021 Document Reviewed: 02/03/2021 Elsevier Patient Education  2023 [employer] Inc[employer]  

## 2023-03-27 ENCOUNTER — Other Ambulatory Visit: Payer: Self-pay

## 2023-03-27 ENCOUNTER — Encounter: Payer: Self-pay | Admitting: Family Medicine

## 2023-03-27 ENCOUNTER — Other Ambulatory Visit: Payer: BC Managed Care – PPO

## 2023-03-27 DIAGNOSIS — Z1329 Encounter for screening for other suspected endocrine disorder: Secondary | ICD-10-CM

## 2023-03-27 DIAGNOSIS — Z0000 Encounter for general adult medical examination without abnormal findings: Secondary | ICD-10-CM

## 2023-03-27 DIAGNOSIS — Z130 Encounter for screening for diseases of the blood and blood-forming organs and certain disorders involving the immune mechanism: Secondary | ICD-10-CM | POA: Diagnosis not present

## 2023-03-27 DIAGNOSIS — Z13228 Encounter for screening for other metabolic disorders: Secondary | ICD-10-CM | POA: Diagnosis not present

## 2023-03-27 DIAGNOSIS — Z1321 Encounter for screening for nutritional disorder: Secondary | ICD-10-CM | POA: Diagnosis not present

## 2023-04-03 ENCOUNTER — Encounter: Payer: Self-pay | Admitting: Family Medicine

## 2023-04-03 ENCOUNTER — Ambulatory Visit: Payer: BC Managed Care – PPO | Admitting: Family Medicine

## 2023-04-03 DIAGNOSIS — J4530 Mild persistent asthma, uncomplicated: Secondary | ICD-10-CM

## 2023-04-03 MED ORDER — ALBUTEROL SULFATE HFA 108 (90 BASE) MCG/ACT IN AERS: 2.0000 | INHALATION_SPRAY | Freq: Four times a day (QID) | RESPIRATORY_TRACT | 2 refills | Status: AC | PRN

## 2023-04-03 NOTE — Progress Notes (Signed)
   Established Patient Office Visit  Subjective   Patient ID: Henry Brady, male    DOB: Jan 02, 1986  Age: 37 y.o. MRN: [id number]  Chief Complaint  Patient presents with   Annual Exam    HPI  Patient here for physical and to discuss labs.  Discussed how his lab work was normal.  Patient has asthma that is worse in the summer and spring.  Usually worse if he is exposed to grass or during windy days.  Uses his albuterol approximately 3 days a week.  Will use it 1-2 times during those days.  Does not take any other medications.  Works in [employer].  He is married.  Has 2 children, ages 57 and 19.  Vapes occasionally, no recreational drug use, occasional alcohol use  Patient states that he has had a tooth removed in the past due to cavity/infection.  Currently not experiencing pain in the tooth that is broken off in the upper right back molar   The ASCVD Risk score (Arnett DK, et al., 2019) failed to calculate for the following reasons:   The 2019 ASCVD risk score is only valid for ages 22 to 81  Health Maintenance Due  Topic Date Due   HIV Screening  Never done   Hepatitis C Screening  Never done   COVID-19 Vaccine (3 - 2023-24 season) 04/22/2022      Objective:     BP 124/81 (BP Location: Left Arm, Patient Position: Sitting, Cuff Size: Normal)   Pulse 70   Resp 18   Ht 5' 4.14" (1.629 m)   Wt 141 lb (64 kg)   SpO2 100%   BMI 24.10 kg/m    Physical Exam General: Alert and oriented HEENT: PERRLA, EOMI, upper right sided molar broken off at the gumline.  No obvious signs of infection CV: Regular rate and rhythm Pulmonary: See bilaterally GI: Soft, nontender MSK: Strength equal bilaterally Psych: Pleasant affect   No results found for any visits on 04/03/23.      Assessment & Plan:   Mild persistent asthma without complication Assessment & Plan: Continue albuterol as needed.  Mild intermittent.  Related to seasonal allergies.  Orders: -      Albuterol Sulfate HFA; Inhale 2 puffs into the lungs every 6 (six) hours as needed for wheezing or shortness of breath.  Dispense: 8 g; Refill: 2     Return in about 1 year (around 04/02/2024) for physical.    Sandre Kitty, MD

## 2023-04-03 NOTE — Patient Instructions (Signed)
It was nice to see you today,  We addressed the following topics today: - continue taking your albuterol as needed.   - follow up in 1 year or sooner if you need to see [place] sooner.   Have a great day,  Frederic Jericho, MD

## 2023-04-03 NOTE — Assessment & Plan Note (Signed)
Continue albuterol as needed.  Mild intermittent.  Related to seasonal allergies.

## 2023-08-06 DIAGNOSIS — K047 Periapical abscess without sinus: Secondary | ICD-10-CM | POA: Diagnosis not present

## 2024-03-26 ENCOUNTER — Other Ambulatory Visit: Payer: BC Managed Care – PPO

## 2024-04-02 ENCOUNTER — Encounter: Payer: BC Managed Care – PPO | Admitting: Family Medicine
